# Patient Record
Sex: Male | Born: 1955 | Race: White | Hispanic: No | Marital: Married | Smoking: Never smoker
Health system: Southern US, Community
[De-identification: ages and names within clinical notes are randomized; demographics above are authoritative.]

## PROBLEM LIST (undated history)

## (undated) DIAGNOSIS — M47812 Spondylosis without myelopathy or radiculopathy, cervical region: Principal | ICD-10-CM

## (undated) HISTORY — DX: Spondylosis without myelopathy or radiculopathy, cervical region: M47.812

## (undated) HISTORY — PX: BACK SURGERY: SHX140

## (undated) HISTORY — PX: OTHER SURGICAL HISTORY: SHX169

## (undated) HISTORY — PX: CATARACT EXTRACTION W/ INTRAOCULAR LENS  IMPLANT, BILATERAL: SHX1307

## (undated) HISTORY — PX: FOOT SURGERY: SHX648

---

## 1999-05-02 ENCOUNTER — Emergency Department (HOSPITAL_COMMUNITY): Admission: EM | Admit: 1999-05-02 | Discharge: 1999-05-02 | Payer: Self-pay | Admitting: Emergency Medicine

## 2000-08-09 ENCOUNTER — Encounter: Payer: Self-pay | Admitting: Neurosurgery

## 2000-08-09 ENCOUNTER — Encounter: Admission: RE | Admit: 2000-08-09 | Discharge: 2000-08-09 | Payer: Self-pay | Admitting: Neurosurgery

## 2000-08-29 ENCOUNTER — Encounter: Admission: RE | Admit: 2000-08-29 | Discharge: 2000-08-29 | Payer: Self-pay | Admitting: Neurosurgery

## 2000-08-29 ENCOUNTER — Encounter: Payer: Self-pay | Admitting: Neurosurgery

## 2005-03-07 ENCOUNTER — Emergency Department (HOSPITAL_COMMUNITY): Admission: EM | Admit: 2005-03-07 | Discharge: 2005-03-07 | Payer: Self-pay | Admitting: Emergency Medicine

## 2012-07-30 ENCOUNTER — Other Ambulatory Visit: Payer: Self-pay | Admitting: Family Medicine

## 2012-07-30 ENCOUNTER — Other Ambulatory Visit: Payer: Self-pay

## 2012-07-30 DIAGNOSIS — R109 Unspecified abdominal pain: Secondary | ICD-10-CM

## 2012-07-31 ENCOUNTER — Ambulatory Visit
Admission: RE | Admit: 2012-07-31 | Discharge: 2012-07-31 | Disposition: A | Discharge status: Home / Self Care | Payer: BC Managed Care – PPO | Source: Ambulatory Visit | Attending: Family Medicine | Admitting: Family Medicine

## 2012-07-31 DIAGNOSIS — R109 Unspecified abdominal pain: Secondary | ICD-10-CM

## 2016-12-01 ENCOUNTER — Ambulatory Visit
Admission: RE | Admit: 2016-12-01 | Discharge: 2016-12-01 | Disposition: A | Discharge status: Home / Self Care | Payer: 59 | Source: Ambulatory Visit | Attending: Family Medicine | Admitting: Family Medicine

## 2016-12-01 ENCOUNTER — Other Ambulatory Visit: Payer: Self-pay | Admitting: Student

## 2016-12-01 DIAGNOSIS — J189 Pneumonia, unspecified organism: Secondary | ICD-10-CM

## 2017-04-29 ENCOUNTER — Emergency Department (HOSPITAL_COMMUNITY): Payer: 59

## 2017-04-29 ENCOUNTER — Emergency Department (HOSPITAL_COMMUNITY)
Admission: EM | Admit: 2017-04-29 | Discharge: 2017-04-29 | Disposition: A | Discharge status: Home / Self Care | Payer: 59 | Attending: Emergency Medicine | Admitting: Emergency Medicine

## 2017-04-29 ENCOUNTER — Encounter (HOSPITAL_COMMUNITY): Payer: Self-pay

## 2017-04-29 DIAGNOSIS — M542 Cervicalgia: Secondary | ICD-10-CM

## 2017-04-29 DIAGNOSIS — M436 Torticollis: Secondary | ICD-10-CM | POA: Diagnosis not present

## 2017-04-29 DIAGNOSIS — Z7982 Long term (current) use of aspirin: Secondary | ICD-10-CM | POA: Diagnosis not present

## 2017-04-29 DIAGNOSIS — Z87891 Personal history of nicotine dependence: Secondary | ICD-10-CM | POA: Diagnosis not present

## 2017-04-29 LAB — CBC WITH DIFFERENTIAL/PLATELET
Basophils Absolute: 0 10*3/uL (ref 0.0–0.1)
Basophils Relative: 0 %
Eosinophils Absolute: 0 10*3/uL (ref 0.0–0.7)
Eosinophils Relative: 0 %
HCT: 39.9 % (ref 39.0–52.0)
Hemoglobin: 13.9 g/dL (ref 13.0–17.0)
Lymphocytes Relative: 13 %
Lymphs Abs: 1.5 10*3/uL (ref 0.7–4.0)
MCH: 30.8 pg (ref 26.0–34.0)
MCHC: 34.8 g/dL (ref 30.0–36.0)
MCV: 88.5 fL (ref 78.0–100.0)
Monocytes Absolute: 1 10*3/uL (ref 0.1–1.0)
Monocytes Relative: 9 %
Neutro Abs: 9 10*3/uL — ABNORMAL HIGH (ref 1.7–7.7)
Neutrophils Relative %: 78 %
Platelets: 215 10*3/uL (ref 150–400)
RBC: 4.51 MIL/uL (ref 4.22–5.81)
RDW: 12.2 % (ref 11.5–15.5)
WBC: 11.5 10*3/uL — ABNORMAL HIGH (ref 4.0–10.5)

## 2017-04-29 LAB — BASIC METABOLIC PANEL
Anion gap: 10 (ref 5–15)
BUN: 15 mg/dL (ref 6–20)
CO2: 25 mmol/L (ref 22–32)
Calcium: 9.3 mg/dL (ref 8.9–10.3)
Chloride: 104 mmol/L (ref 101–111)
Creatinine, Ser: 0.71 mg/dL (ref 0.61–1.24)
GFR calc Af Amer: >60 mL/min (ref >60)
GFR calc non Af Amer: >60 mL/min (ref >60)
Glucose, Bld: 215 mg/dL — ABNORMAL HIGH (ref 65–99)
Potassium: 4.1 mmol/L (ref 3.5–5.1)
Sodium: 139 mmol/L (ref 135–145)

## 2017-04-29 LAB — I-STAT CHEM 8, ED
BUN: 16 mg/dL (ref 6–20)
Calcium, Ion: 1.1 mmol/L — ABNORMAL LOW (ref 1.15–1.40)
Chloride: 104 mmol/L (ref 101–111)
Creatinine, Ser: 0.6 mg/dL — ABNORMAL LOW (ref 0.61–1.24)
Glucose, Bld: 220 mg/dL — ABNORMAL HIGH (ref 65–99)
HCT: 41 % (ref 39.0–52.0)
Hemoglobin: 13.9 g/dL (ref 13.0–17.0)
Potassium: 4.1 mmol/L (ref 3.5–5.1)
Sodium: 138 mmol/L (ref 135–145)
TCO2: 27 mmol/L (ref 0–100)

## 2017-04-29 LAB — I-STAT TROPONIN, ED: Troponin i, poc: 0 ng/mL (ref 0.00–0.08)

## 2017-04-29 MED ORDER — ASPIRIN EC 81 MG PO TBEC: 81.0000 mg | DELAYED_RELEASE_TABLET | Freq: Every day | ORAL | 0 refills | Status: AC

## 2017-04-29 MED ORDER — KETOROLAC TROMETHAMINE 60 MG/2ML IM SOLN
60.0000 mg | Freq: Once | INTRAMUSCULAR | Status: AC
  Administered 2017-04-29: 60 mg via INTRAMUSCULAR
  Filled 2017-04-29: qty 2

## 2017-04-29 MED ORDER — DIPHENHYDRAMINE HCL 50 MG/ML IJ SOLN
25.0000 mg | Freq: Once | INTRAMUSCULAR | Status: AC
  Administered 2017-04-29: 25 mg via INTRAVENOUS
  Filled 2017-04-29: qty 1

## 2017-04-29 MED ORDER — DEXAMETHASONE 4 MG PO TABS
10.0000 mg | ORAL_TABLET | Freq: Once | ORAL | Status: AC
  Administered 2017-04-29: 10 mg via ORAL
  Filled 2017-04-29: qty 2

## 2017-04-29 MED ORDER — CYCLOBENZAPRINE HCL 10 MG PO TABS: 10.0000 mg | ORAL_TABLET | Freq: Three times a day (TID) | ORAL | 0 refills | Status: DC | PRN

## 2017-04-29 MED ORDER — METOCLOPRAMIDE HCL 5 MG/ML IJ SOLN
10.0000 mg | Freq: Once | INTRAMUSCULAR | Status: AC
  Administered 2017-04-29: 10 mg via INTRAVENOUS
  Filled 2017-04-29: qty 2

## 2017-04-29 MED ORDER — IOPAMIDOL (ISOVUE-370) INJECTION 76%
INTRAVENOUS | Status: AC
  Administered 2017-04-29: 100 mL via INTRAVENOUS
  Filled 2017-04-29: qty 100

## 2017-04-29 MED ORDER — HYDROCODONE-ACETAMINOPHEN 5-325 MG PO TABS: 1.0000 | ORAL_TABLET | ORAL | 0 refills | Status: DC | PRN

## 2017-04-29 MED ORDER — DIAZEPAM 5 MG PO TABS
5.0000 mg | ORAL_TABLET | Freq: Once | ORAL | Status: AC
  Administered 2017-04-29: 5 mg via ORAL
  Filled 2017-04-29: qty 1

## 2017-04-29 MED ORDER — NAPROXEN 500 MG PO TABS: 500.0000 mg | ORAL_TABLET | Freq: Two times a day (BID) | ORAL | 0 refills | Status: AC

## 2017-04-29 NOTE — ED Provider Notes (Signed)
[facility]-EMERGENCY DEPT Provider Note   CSN: [id number] Arrival date & time: 04/29/17  1348     History   Chief Complaint Chief Complaint  Patient presents with  . Neck Pain    HPI Seth Adams is a 61 y.o. male.  HPI   61 yo M with PMHx as below here with multiple complaints. Pt states that he just had his eyes operated on last month (left and right cataracts s/p IOL implants). He had been recovering well from this. Approx 2 weeks ago, following an argument with this son, he noticed acute onset of left mid and lower facial numbness. He also had transient left arm tingling. Since then, his arm tingling has improved but he has had persistent left facial numbness. Over the past 2 days, he has also noticed posterior neck pain and stiffness. He had subjective chills last night. No vision changes. He went to Dr. Sherryll Burger today thinking it was his eyes but was sent here as his eyes "looked great." Denies h/o CVA or TIA. No current neck stiffness, photophobia, or phonophobia. He is not diabetic or immune suppressed. No other medical complaints.  No past medical history on file.  There are no active problems to display for this patient.   Past Surgical History:  Procedure Laterality Date  . CATARACT EXTRACTION W/ INTRAOCULAR LENS  IMPLANT, BILATERAL         Home Medications    Prior to Admission medications   Medication Sig Start Date End Date Taking? Authorizing Provider  CALCIUM PO Take 1 tablet by mouth daily.   Yes [provider]  Cholecalciferol (VITAMIN D PO) Take 1 tablet by mouth daily.   Yes [provider]  CINNAMON PO Take 1 tablet by mouth 2 (two) times daily.   Yes [provider]  colchicine 0.6 MG tablet Take 0.6 mg by mouth daily.   Yes [provider]  fluticasone (FLONASE) 50 MCG/ACT nasal spray Place 2 sprays into both nostrils daily.   Yes [provider]  GARLIC PO Take 1 tablet by mouth 2 (two) times daily.   Yes  [provider]  Multiple Vitamin (MULTIVITAMIN WITH MINERALS) TABS tablet Take 1 tablet by mouth daily.   Yes [provider]  OVER THE COUNTER MEDICATION Take 5 drops by mouth daily. Mixes Frankincense and Thieves in liquid to drink daily   Yes [provider]  prednisoLONE acetate (PRED FORTE) 1 % ophthalmic suspension Place 1 drop into both eyes daily. 04/21/17  Yes [provider]  TURMERIC PO Take 1 tablet by mouth daily.   Yes [provider]  aspirin EC 81 MG tablet Take 1 tablet (81 mg total) by mouth daily. 04/29/17 05/29/17  Shaune Pollack, MD  cyclobenzaprine (FLEXERIL) 10 MG tablet Take 1 tablet (10 mg total) by mouth 3 (three) times daily as needed for muscle spasms. 04/29/17   Shaune Pollack, MD  HYDROcodone-acetaminophen (NORCO/VICODIN) 5-325 MG tablet Take 1-2 tablets by mouth every 4 (four) hours as needed for severe pain. 04/29/17   Shaune Pollack, MD  naproxen (NAPROSYN) 500 MG tablet Take 1 tablet (500 mg total) by mouth 2 (two) times daily with a meal. 04/29/17 05/06/17  Shaune Pollack, MD    Family History No family history on file.  Social History Social History  Substance Use Topics  . Smoking status: Never Smoker  . Smokeless tobacco: Never Used  . Alcohol use No     Allergies   Codeine   Review  of Systems Review of Systems  Constitutional: Positive for fatigue.  Musculoskeletal: Positive for neck pain and neck stiffness.  Neurological: Positive for numbness and headaches.  All other systems reviewed and are negative.    Physical Exam Updated Vital Signs BP (!) 176/99 (BP Location: Right Arm)   Pulse 78   Temp 98.5 F (36.9 C) (Oral)   Resp 18   SpO2 97%   Physical Exam  Constitutional: He is oriented to person, place, and time. He appears well-developed and well-nourished. No distress.  HENT:  Head: Normocephalic and atraumatic.  Eyes: Conjunctivae are normal.  Neck: Neck supple.  Moderate TTP over  bilateral, L>R SCM and posterior neck muscles. No midline TTP. No deformity.  Cardiovascular: Normal rate, regular rhythm and normal heart sounds.  Exam reveals no friction rub.   No murmur heard. Pulmonary/Chest: Effort normal and breath sounds normal. No respiratory distress. He has no wheezes. He has no rales.  Abdominal: He exhibits no distension.  Musculoskeletal: He exhibits no edema.  Neurological: He is alert and oriented to person, place, and time. He exhibits normal muscle tone.  Skin: Skin is warm. Capillary refill takes less than 2 seconds.  Psychiatric: He has a normal mood and affect.  Nursing note and vitals reviewed.   Neurological Exam:  Mental Status: Alert and oriented to person, place, and time. Attention and concentration normal. Speech clear. Recent memory is intact. Cranial Nerves: Visual fields grossly intact. EOMI and PERRLA. No nystagmus noted. Facial sensation intact at forehead, maxillary cheek, and chin/mandible bilaterally. No facial asymmetry or weakness. Hearing grossly normal. Uvula is midline, and palate elevates symmetrically. Normal SCM and trapezius strength. Tongue midline without fasciculations. Motor: Muscle strength 5/5 in proximal and distal UE and LE bilaterally. No pronator drift. Muscle tone normal. Reflexes: 2+ and symmetrical in all four extremities.  Sensation: Intact to light touch in upper and lower extremities distally bilaterally.  Gait: Normal without ataxia. Coordination: Normal FTN bilaterally.  ED Treatments / Results  Labs (all labs ordered are listed, but only abnormal results are displayed) Labs Reviewed  CBC WITH DIFFERENTIAL/PLATELET - Abnormal; Notable for the following:       Result Value   WBC 11.5 (*)    Neutro Abs 9.0 (*)    All other components within normal limits  BASIC METABOLIC PANEL - Abnormal; Notable for the following:    Glucose, Bld 215 (*)    All other components within normal limits  I-STAT CHEM 8, ED -  Abnormal; Notable for the following:    Creatinine, Ser 0.60 (*)    Glucose, Bld 220 (*)    Calcium, Ion 1.10 (*)    All other components within normal limits  I-STAT TROPOININ, ED    EKG  EKG Interpretation  Date/Time:  Saturday Apr 29 2017 19:42:29 EDT Ventricular Rate:  70 PR Interval:    QRS Duration: 104 QT Interval:  410 QTC Calculation: 443 R Axis:   -29 Text Interpretation:  Sinus rhythm Borderline left axis deviation Confirmed by Daniesha Driver MD, Sheria Lang [phone]) on 04/29/2017 9:02:15 PM       Radiology Ct Angio Head W Or Wo Contrast  Result Date: 04/29/2017 CLINICAL DATA:  Posterior headache.  Neck pain and facial numbness. Patient reports left facial paresthesias for 1 week and neck pain and stiffness for 2 days. EXAM: CT ANGIOGRAPHY HEAD AND NECK TECHNIQUE: Multidetector CT imaging of the head and neck was performed using the standard protocol during bolus administration of intravenous contrast. Multiplanar  CT image reconstructions and MIPs were obtained to evaluate the vascular anatomy. Carotid stenosis measurements (when applicable) are obtained utilizing NASCET criteria, using the distal internal carotid diameter as the denominator. CONTRAST:  100 cc Isovue 370 intravenous COMPARISON:  None. FINDINGS: CT HEAD FINDINGS Brain: No evidence of acute infarction, hemorrhage, hydrocephalus, extra-axial collection or mass lesion/mass effect. Mild to moderate patchy low-density in the cerebral white matter. Vascular: See below Skull: No acute or aggressive finding. Band of scar-like density along the posterior scalp. Sinuses: Clear Orbits: Bilateral cataract resection.  No acute finding. Review of the MIP images confirms the above findings CTA NECK FINDINGS Aortic arch: Mild atherosclerotic calcification. Three vessel branching. Right carotid system: Moderate calcified and noncalcified plaque at the common carotid bifurcation with no significant stenosis. Intermittently distorted ICA due to  motion and streak artifact. No suspected dissection. No beading suspected. Left carotid system: Moderate calcified plaque at the common carotid bifurcation without stenosis or ulceration. Intermittent motion and streak artifact over the ICA without suspected superimposed dissection or beading. Vertebral arteries: No brachiocephalic or subclavian flow limiting stenosis. Intermittently seen atheromatous calcification of the bilateral cervical vertebral arteries. No flow limiting stenosis. No indication of superimposed dissection. Skeleton: Diffuse disc degeneration and endplate spurring, advanced. There is asymmetric bulky left facet arthropathy with C4-5 ankylosis and C2-3 facet arthritis. Advanced asymmetric right C7-T1 facet arthritis. Remote T2 superior endplate fracture. No acute finding to explain neck pain Other neck: No noted inflammation or gross mass lesion. Upper chest: Negative Review of the MIP images confirms the above findings CTA HEAD FINDINGS Anterior circulation: Atheromatous changes on the bilateral carotid siphons without flow limiting stenosis. No major branch occlusion or flow limiting stenosis. Negative for aneurysm. Posterior circulation: Right dominant vertebral artery. No unusual vertebrobasilar branching. The vertebral and basilar arteries are smooth and widely patent. Good patency of bilateral PCAs. Venous sinuses: Patent Anatomic variants: None Delayed phase: No abnormal intracranial enhancement. Review of the MIP images confirms the above findings IMPRESSION: 1. No acute finding or specific explanation for symptoms. No evidence of dissection. 2. Moderate cervical carotid atherosclerosis without flow limiting stenosis. 3. Mild intracranial atherosclerosis. 4. Motion degraded at the level of the neck. 5. Advanced disc and facet degeneration. No acute superimposed finding to explain neck pain. 6. Mild to moderate cerebral white matter disease, nonspecific. Electronically Signed   By:  Marnee Spring M.D.   On: 04/29/2017 17:49   Ct Angio Neck W And/or Wo Contrast  Result Date: 04/29/2017 CLINICAL DATA:  Posterior headache.  Neck pain and facial numbness. Patient reports left facial paresthesias for 1 week and neck pain and stiffness for 2 days. EXAM: CT ANGIOGRAPHY HEAD AND NECK TECHNIQUE: Multidetector CT imaging of the head and neck was performed using the standard protocol during bolus administration of intravenous contrast. Multiplanar CT image reconstructions and MIPs were obtained to evaluate the vascular anatomy. Carotid stenosis measurements (when applicable) are obtained utilizing NASCET criteria, using the distal internal carotid diameter as the denominator. CONTRAST:  100 cc Isovue 370 intravenous COMPARISON:  None. FINDINGS: CT HEAD FINDINGS Brain: No evidence of acute infarction, hemorrhage, hydrocephalus, extra-axial collection or mass lesion/mass effect. Mild to moderate patchy low-density in the cerebral white matter. Vascular: See below Skull: No acute or aggressive finding. Band of scar-like density along the posterior scalp. Sinuses: Clear Orbits: Bilateral cataract resection.  No acute finding. Review of the MIP images confirms the above findings CTA NECK FINDINGS Aortic arch: Mild atherosclerotic calcification. Three vessel branching. Right carotid  system: Moderate calcified and noncalcified plaque at the common carotid bifurcation with no significant stenosis. Intermittently distorted ICA due to motion and streak artifact. No suspected dissection. No beading suspected. Left carotid system: Moderate calcified plaque at the common carotid bifurcation without stenosis or ulceration. Intermittent motion and streak artifact over the ICA without suspected superimposed dissection or beading. Vertebral arteries: No brachiocephalic or subclavian flow limiting stenosis. Intermittently seen atheromatous calcification of the bilateral cervical vertebral arteries. No flow limiting  stenosis. No indication of superimposed dissection. Skeleton: Diffuse disc degeneration and endplate spurring, advanced. There is asymmetric bulky left facet arthropathy with C4-5 ankylosis and C2-3 facet arthritis. Advanced asymmetric right C7-T1 facet arthritis. Remote T2 superior endplate fracture. No acute finding to explain neck pain Other neck: No noted inflammation or gross mass lesion. Upper chest: Negative Review of the MIP images confirms the above findings CTA HEAD FINDINGS Anterior circulation: Atheromatous changes on the bilateral carotid siphons without flow limiting stenosis. No major branch occlusion or flow limiting stenosis. Negative for aneurysm. Posterior circulation: Right dominant vertebral artery. No unusual vertebrobasilar branching. The vertebral and basilar arteries are smooth and widely patent. Good patency of bilateral PCAs. Venous sinuses: Patent Anatomic variants: None Delayed phase: No abnormal intracranial enhancement. Review of the MIP images confirms the above findings IMPRESSION: 1. No acute finding or specific explanation for symptoms. No evidence of dissection. 2. Moderate cervical carotid atherosclerosis without flow limiting stenosis. 3. Mild intracranial atherosclerosis. 4. Motion degraded at the level of the neck. 5. Advanced disc and facet degeneration. No acute superimposed finding to explain neck pain. 6. Mild to moderate cerebral white matter disease, nonspecific. Electronically Signed   By: Marnee SpringJonathon  Watts M.D.   On: 04/29/2017 17:49   Mr Brain Wo Contrast  Result Date: 04/29/2017 CLINICAL DATA:  Posterior headache with neck pain and left facial numbness. EXAM: MRI HEAD WITHOUT CONTRAST TECHNIQUE: Multiplanar, multiecho pulse sequences of the brain and surrounding structures were obtained without intravenous contrast. COMPARISON:  Head CT and CTA from earlier today FINDINGS: Brain: No acute infarction, hemorrhage, hydrocephalus, extra-axial collection or mass lesion.  Moderate cerebral white matter disease with signal abnormality seen from the juxta cortical to periventricular white matter. This pattern is nonspecific and often attributed to chronic small vessel ischemia. Multiple inflammatory processes and demyelinating syndromes can also give this appearance. It is notable but there is no infratentorial signal abnormalities, ovoid radiating pattern, corpus callosum involvement, or black holes as often seen with multiple sclerosis. No abnormality seen along the course of the trigeminal nerves. Vascular: Major flow voids are preserved. Skull and upper cervical spine: Negative for marrow lesion. Sinuses/Orbits: Mild mucosal thickening in the paranasal sinuses, not to a degree that would explain left facial symptoms. IMPRESSION: 1. No acute finding. 2. Moderate cerebral white matter disease that is nonspecific and described above. Electronically Signed   By: Marnee SpringJonathon  Watts M.D.   On: 04/29/2017 19:17    Procedures Procedures (including critical care time)  Medications Ordered in ED Medications  metoCLOPramide (REGLAN) injection 10 mg (10 mg Intravenous Given 04/29/17 1542)  diphenhydrAMINE (BENADRYL) injection 25 mg (25 mg Intravenous Given 04/29/17 1541)  diazepam (VALIUM) tablet 5 mg (5 mg Oral Given 04/29/17 1541)  iopamidol (ISOVUE-370) 76 % injection (100 mLs Intravenous Contrast Given 04/29/17 1720)  ketorolac (TORADOL) injection 60 mg (60 mg Intramuscular Given 04/29/17 1956)  dexamethasone (DECADRON) tablet 10 mg (10 mg Oral Given 04/29/17 1954)     Initial Impression / Assessment and Plan / ED Course  I have reviewed the triage vital signs and the nursing notes.  Pertinent labs & imaging results that were available during my care of the patient were reviewed by me and considered in my medical decision making (see chart for details).    61 yo M with PMHx as above here with left facial numbness/tingling, mild neck pain, posterior headache. On arrival, VSS with  exception of HTN. DDx includes: CVA, TIA, carotid/vertebral dissection given relation to stressful event while talking to son. No fevers, pt is o/w very well appearing - do not suspect meningitis or encephalitis. Will check labs, CT, MR and re-assess.  Labs, imaging as above. Pt has mild leukocytosis which I suspect is 2/2 his prednisone use for his eyes. BMP unremarkable. Trop neg, EKG non-ischemic. CT and MR imaging as below. He does have significant cervical degenerative disease and I highly suspect that pt's sx are 2/2 paracervical muscle spasm 2/2 cervical disc disease, likely exacerbated in setting of yelling during the argument. His facial numbness is along the left aspect of his mouth and does not fit a trigeminal nerve distribution or other neurological distribution. I discussed imaging findings and lab work with the patient and family in detail. I have a low suspicion for CVA, TIA given normal MRI despite constant symptoms, as well as meningitis or encephalitis given his otherwise very well appearance, stable vital signs, absence of fever, and reproducible pain with palpation of paracervical muscles. Given reassuring labs and imaging an improvement with treatment here, will discharge with outpatient follow-up.  Final Clinical Impressions(s) / ED Diagnoses   Final diagnoses:  Cervicalgia  Torticollis    New Prescriptions Discharge Medication List as of 04/29/2017  7:43 PM    START taking these medications   Details  cyclobenzaprine (FLEXERIL) 10 MG tablet Take 1 tablet (10 mg total) by mouth 3 (three) times daily as needed for muscle spasms., Starting Sat 04/29/2017, Print    HYDROcodone-acetaminophen (NORCO/VICODIN) 5-325 MG tablet Take 1-2 tablets by mouth every 4 (four) hours as needed for severe pain., Starting Sat 04/29/2017, Print    naproxen (NAPROSYN) 500 MG tablet Take 1 tablet (500 mg total) by mouth 2 (two) times daily with a meal., Starting Sat 04/29/2017, Until Sat 05/06/2017, Print           Shaune Pollack, MD 04/29/17 2141

## 2017-04-29 NOTE — ED Triage Notes (Signed)
He mentions that he had bilat. Cataract surgery with I.O.L. Implants about a month ago. He is here today with c/o left facial paresthesias x 1 week. He also c/o "really bad" neck pain and stiffness x 2 days. He had initially attributed his left facial paresthesias to the use of his eyedrops, but he is becoming concerned because it is persisting.

## 2017-04-29 NOTE — ED Notes (Signed)
Patient transported to MRI 

## 2017-06-13 ENCOUNTER — Encounter: Payer: Self-pay | Admitting: Neurology

## 2017-06-13 ENCOUNTER — Ambulatory Visit (INDEPENDENT_AMBULATORY_CARE_PROVIDER_SITE_OTHER): Payer: 59 | Admitting: Neurology

## 2017-06-13 VITALS — BP 179/94 | HR 68 | Ht 68.0 in | Wt 185.5 lb

## 2017-06-13 DIAGNOSIS — R202 Paresthesia of skin: Secondary | ICD-10-CM | POA: Diagnosis not present

## 2017-06-13 NOTE — Progress Notes (Signed)
Reason for visit: Left facial and arm numbness  Referring physician: Dr. Rocky LinkElkins  Seth Adams is a 61 y.o. male  History of present illness:  Seth Adams is a 61 year old white male with a history of onset of neck pain and left lower face numbness and numbness of the left hand that began around 04/26/2017. The patient went to the emergency room on May 5, he underwent a TIA workup that included MRI of the brain that showed evidence of a moderate level of chronic small vessel ischemic changes, no acute changes were seen. CT angiography of the head and neck was done and did not show evidence of large vessel high-grade stenosis or occlusion. The patient has continued to report left facial numbness and some tingling in the left hand, he has ongoing neck pain and neck stiffness, he has some discomfort down into the shoulders. He denies any true weakness of the arms or legs, he denies numbness of the right arm or the legs, he has not had any change in balance or difficulty controlling the bowels or the bladder. He has had no visual changes. He reports increased discomfort with turning the head from side to side. He is sent to this office for further management.  No past medical history on file.  Past Surgical History:  Procedure Laterality Date  . BACK SURGERY     Lower back surgery x2  . CATARACT EXTRACTION W/ INTRAOCULAR LENS  IMPLANT, BILATERAL    . FOOT SURGERY      History reviewed. No pertinent family history.  Social history:  reports that he has never smoked. He has never used smokeless tobacco. He reports that he does not drink alcohol. His drug history is not on file.  Medications:  Prior to Admission medications   Medication Sig Start Date End Date Taking? Authorizing Provider  CALCIUM PO Take 1 tablet by mouth daily.   Yes [provider]  Cholecalciferol (VITAMIN D PO) Take 1 tablet by mouth daily.   Yes [provider]  CINNAMON PO Take 1 tablet by mouth  2 (two) times daily.   Yes [provider]  colchicine 0.6 MG tablet Take 0.6 mg by mouth daily.   Yes [provider]  cyclobenzaprine (FLEXERIL) 10 MG tablet Take 1 tablet (10 mg total) by mouth 3 (three) times daily as needed for muscle spasms. 04/29/17  Yes Shaune PollackIsaacs, Cameron, MD  fluticasone (FLONASE) 50 MCG/ACT nasal spray Place 2 sprays into both nostrils daily.   Yes [provider]  GARLIC PO Take 1 tablet by mouth 2 (two) times daily.   Yes [provider]  HYDROcodone-acetaminophen (NORCO/VICODIN) 5-325 MG tablet Take 1-2 tablets by mouth every 4 (four) hours as needed for severe pain. 04/29/17  Yes Shaune PollackIsaacs, Cameron, MD  Multiple Vitamin (MULTIVITAMIN WITH MINERALS) TABS tablet Take 1 tablet by mouth daily.   Yes [provider]  naproxen (NAPROSYN) 500 MG tablet Take 500 mg by mouth as needed. 05/10/17  Yes [provider]  OVER THE COUNTER MEDICATION Take 5 drops by mouth daily. Mixes Frankincense and Thieves in liquid to drink daily   Yes [provider]  prednisoLONE acetate (PRED FORTE) 1 % ophthalmic suspension Place 1 drop into both eyes daily. 04/21/17  Yes [provider]  TURMERIC PO Take 1 tablet by mouth daily.   Yes [provider]      Allergies  Allergen Reactions  . Codeine Itching and Nausea Only  ROS:  Out of a complete 14 system review of symptoms, the patient complains only of the following symptoms, and all other reviewed systems are negative.  Hearing loss Joint pain  Blood pressure (!) 179/94, pulse 68, height 5\' 8"  (1.727 m), weight 185 lb 8 oz (84.1 kg).  Physical Exam  General: The patient is alert and cooperative at the time of the examination.  Eyes: Pupils are equal, round, and reactive to light. Discs are flat bilaterally.  Neck: The neck is supple, no carotid bruits are noted.  Respiratory: The respiratory examination is clear.  Cardiovascular: The cardiovascular  examination reveals a regular rate and rhythm, no obvious murmurs or rubs are noted.  Neuromuscular: The patient lacks about 30 of full lateral rotation to the left, 20 to the right of the cervical spine.  Skin: Extremities are without significant edema.  Neurologic Exam  Mental status: The patient is alert and oriented x 3 at the time of the examination. The patient has apparent normal recent and remote memory, with an apparently normal attention span and concentration ability.  Cranial nerves: Facial symmetry is present. There is good sensation of the face to pinprick and soft touch bilaterally on the forehead, slightly decreased on the left lower face as compared to the right. The strength of the facial muscles and the muscles to head turning and shoulder shrug are normal bilaterally. Speech is well enunciated, no aphasia or dysarthria is noted. Extraocular movements are full. Visual fields are full. The tongue is midline, and the patient has symmetric elevation of the soft palate. No obvious hearing deficits are noted.  Motor: The motor testing reveals 5 over 5 strength of all 4 extremities. Good symmetric motor tone is noted throughout.  Sensory: Sensory testing is intact to pinprick, soft touch, vibration sensation, and position sense on all 4 extremities. No evidence of extinction is noted.  Coordination: Cerebellar testing reveals good finger-nose-finger and heel-to-shin bilaterally.  Gait and station: Gait is normal. Tandem gait is normal. Romberg is negative. No drift is seen.  Reflexes: Deep tendon reflexes are symmetric and normal bilaterally, with exception that there may be a slight elevation in the left biceps reflex as compared the right. Toes are downgoing bilaterally.    MRI brain 04/29/17:  IMPRESSION: 1. No acute finding. 2. Moderate cerebral white matter disease that is nonspecific and described above.  * MRI scan images were reviewed online. I agree with the  written report.   CTA head and neck 04/29/17:  IMPRESSION: 1. No acute finding or specific explanation for symptoms. No evidence of dissection. 2. Moderate cervical carotid atherosclerosis without flow limiting stenosis. 3. Mild intracranial atherosclerosis. 4. Motion degraded at the level of the neck. 5. Advanced disc and facet degeneration. No acute superimposed finding to explain neck pain. 6. Mild to moderate cerebral white matter disease, nonspecific.   Assessment/Plan:  1. Neck stiffness and discomfort  2. Numbness and paresthesias of the left lower face, left hand  The patient has had persistent sensory changes. The patient does have some degree of small vessel ischemic changes but these are not acute in nature. We will need to evaluate the patient for significant cervical spine disease that would explain his neck pain and sensory changes. The patient will be sent for MRI of the cervical spine. He will follow-up in about 3 months.  Marlan Palau MD 06/13/2017 11:37 AM  [facility] [address] [place], [place] [address]  Phone [phone] Fax [phone]

## 2017-06-13 NOTE — Patient Instructions (Signed)
   We will check MRI of the neck. 

## 2017-06-21 ENCOUNTER — Ambulatory Visit (INDEPENDENT_AMBULATORY_CARE_PROVIDER_SITE_OTHER): Payer: 59

## 2017-06-21 ENCOUNTER — Encounter (INDEPENDENT_AMBULATORY_CARE_PROVIDER_SITE_OTHER): Payer: Self-pay

## 2017-06-21 DIAGNOSIS — R202 Paresthesia of skin: Secondary | ICD-10-CM

## 2017-06-24 ENCOUNTER — Telehealth: Payer: Self-pay | Admitting: Neurology

## 2017-06-24 DIAGNOSIS — M47812 Spondylosis without myelopathy or radiculopathy, cervical region: Secondary | ICD-10-CM

## 2017-06-24 MED ORDER — CYCLOBENZAPRINE HCL 10 MG PO TABS: 10.0000 mg | ORAL_TABLET | Freq: Three times a day (TID) | ORAL | 1 refills | Status: DC

## 2017-06-24 NOTE — Telephone Encounter (Signed)
I called patient. The MRI shows some this fullness and degenerative changes, the patient likely is having paresthesias associated with neck spasm, cyclobenzaprine did help him. I will call in another prescription for Flexeril and get him set up for neuromuscular therapy on the neck.   MRI cervical 06/22/17:  IMPRESSION:  Abnormal MRI cervical spine (without) demonstrating: 1. At C5-6: disc bulging and uncovertebral joint hypertrophy with moderate right foraminal stenosis  2. At C6-7: disc bulging with moderate left foraminal stenosis  3. At C2-3: uncovertebral joint hypertrophy with mild left biforaminal stenosis  4. At C3-4: uncovertebral joint hypertrophy with mild right foraminal stenosis

## 2017-06-26 ENCOUNTER — Other Ambulatory Visit: Payer: Self-pay | Admitting: *Deleted

## 2017-06-26 MED ORDER — CYCLOBENZAPRINE HCL 10 MG PO TABS: 10.0000 mg | ORAL_TABLET | Freq: Three times a day (TID) | ORAL | 1 refills | Status: DC

## 2017-09-20 ENCOUNTER — Ambulatory Visit (INDEPENDENT_AMBULATORY_CARE_PROVIDER_SITE_OTHER): Payer: 59 | Admitting: Adult Health

## 2017-09-20 ENCOUNTER — Encounter: Payer: Self-pay | Admitting: Adult Health

## 2017-09-20 VITALS — BP 180/84 | HR 60 | Wt 178.2 lb

## 2017-09-20 DIAGNOSIS — R202 Paresthesia of skin: Secondary | ICD-10-CM | POA: Diagnosis not present

## 2017-09-20 DIAGNOSIS — M542 Cervicalgia: Secondary | ICD-10-CM | POA: Diagnosis not present

## 2017-09-20 NOTE — Progress Notes (Signed)
PATIENT: Seth Adams DOB: [birth year]  REASON FOR VISIT: follow up HISTORY FROM: patient  HISTORY OF PRESENT ILLNESS: Today 09/20/17 Mr. Gailey is a 61 year old male with a history of neck pain and left facial numbness and tingling. He returns today for follow-up. The patient did have a MRI of the cervical spine that shows some degenerative changes. He reports that he continues to have significant neck pain as well as sensory changes on the left side of the face. He simply states that it feels different when you touch the left side versus the right. He also reports some tingling in the left side of face. He denies any numbness or tingling in extremities. He reports that he often feels very stiff and he has trouble moving to the left and right. Neuromuscular therapy was set up for the patient however he reports that he never got a call about this. He has been taking Flexeril at bedtime he feels that may offer him some benefit but has not resolved his neck pain nor sensory changes.   HISTORY 06/13/17: Mr. Gillie is a 60 year old white male with a history of onset of neck pain and left lower face numbness and numbness of the left hand that began around 04/26/2017. The patient went to the emergency room on May 5, he underwent a TIA workup that included MRI of the brain that showed evidence of a moderate level of chronic small vessel ischemic changes, no acute changes were seen. CT angiography of the head and neck was done and did not show evidence of large vessel high-grade stenosis or occlusion. The patient has continued to report left facial numbness and some tingling in the left hand, he has ongoing neck pain and neck stiffness, he has some discomfort down into the shoulders. He denies any true weakness of the arms or legs, he denies numbness of the right arm or the legs, he has not had any change in balance or difficulty controlling the bowels or the bladder. He has had no visual changes. He reports  increased discomfort with turning the head from side to side. He is sent to this office for further management.  REVIEW OF SYSTEMS: Out of a complete 14 system review of symptoms, the patient complains only of the following symptoms, and all other reviewed systems are negative.  See history of present illness  ALLERGIES: Allergies  Allergen Reactions  . Codeine Itching and Nausea Only    HOME MEDICATIONS: Outpatient Medications Prior to Visit  Medication Sig Dispense Refill  . CALCIUM PO Take 1 tablet by mouth daily.    . Cholecalciferol (VITAMIN D PO) Take 1 tablet by mouth daily.    Marland Kitchen CINNAMON PO Take 1 tablet by mouth 2 (two) times daily.    . colchicine 0.6 MG tablet Take 0.6 mg by mouth daily.    . cyclobenzaprine (FLEXERIL) 10 MG tablet Take 1 tablet (10 mg total) by mouth 3 (three) times daily. 90 tablet 1  . fluticasone (FLONASE) 50 MCG/ACT nasal spray Place 2 sprays into both nostrils daily.    Marland Kitchen GARLIC PO Take 1 tablet by mouth 2 (two) times daily.    Marland Kitchen HYDROcodone-acetaminophen (NORCO/VICODIN) 5-325 MG tablet Take 1-2 tablets by mouth every 4 (four) hours as needed for severe pain. 10 tablet 0  . Multiple Vitamin (MULTIVITAMIN WITH MINERALS) TABS tablet Take 1 tablet by mouth daily.    . naproxen (NAPROSYN) 500 MG tablet Take 500 mg by mouth as needed.    Marland Kitchen  OVER THE COUNTER MEDICATION Take 5 drops by mouth daily. Mixes Frankincense and Thieves in liquid to drink daily    . TURMERIC PO Take 1 tablet by mouth daily.    . prednisoLONE acetate (PRED FORTE) 1 % ophthalmic suspension Place 1 drop into both eyes daily.     No facility-administered medications prior to visit.     PAST MEDICAL HISTORY: No past medical history on file.  PAST SURGICAL HISTORY: Past Surgical History:  Procedure Laterality Date  . BACK SURGERY     Lower back surgery x2  . CATARACT EXTRACTION W/ INTRAOCULAR LENS  IMPLANT, BILATERAL    . FOOT SURGERY      FAMILY HISTORY: No family history on  file.  SOCIAL HISTORY: Social History   Social History  . Marital status: Married    Spouse name: Olegario Messier   . Number of children: 1  . Years of education: 46   Occupational History  . Self employed    Social History Main Topics  . Smoking status: Never Smoker  . Smokeless tobacco: Never Used  . Alcohol use No  . Drug use: Unknown  . Sexual activity: Not on file   Other Topics Concern  . Not on file   Social History Narrative   Lives with wife   Caffeine use: 1 5-hour energy drink per day   Right handed      PHYSICAL EXAM  Vitals:   09/20/17 1121  BP: (!) 180/84  Pulse: 60  Weight: 178 lb 3.2 oz (80.8 kg)   Body mass index is 27.1 kg/m.  Generalized: Well developed, in no acute distress   Neurological examination  Mentation: Alert oriented to time, place, history taking. Follows all commands speech and language fluent Cranial nerve II-XII: Pupils were equal round reactive to light. Extraocular movements were full, visual field were full on confrontational test. Facial sensation and strength were normal. Uvula tongue midline. Head turning and shoulder shrug  were normal and symmetric. Motor: The motor testing reveals 5 over 5 strength of all 4 extremities. Good symmetric motor tone is noted throughout.  Sensory: Sensory testing is intact to soft touch on all 4 extremities. No evidence of extinction is noted.  Coordination: Cerebellar testing reveals good finger-nose-finger and heel-to-shin bilaterally.  Gait and station: Gait is normal. Tandem gait is normal. Romberg is negative. No drift is seen.  Reflexes: Deep tendon reflexes are symmetric and normal bilaterally.   DIAGNOSTIC DATA (LABS, IMAGING, TESTING) - I reviewed patient records, labs, notes, testing and imaging myself where available.  Lab Results  Component Value Date   WBC 11.5 (H) 04/29/2017   HGB 13.9 04/29/2017   HCT 41.0 04/29/2017   MCV 88.5 04/29/2017   PLT 215 04/29/2017      Component  Value Date/Time   NA 138 04/29/2017 1553   K 4.1 04/29/2017 1553   CL 104 04/29/2017 1553   CO2 25 04/29/2017 1535   GLUCOSE 220 (H) 04/29/2017 1553   BUN 16 04/29/2017 1553   CREATININE 0.60 (L) 04/29/2017 1553   CALCIUM 9.3 04/29/2017 1535   GFRNONAA >60 04/29/2017 1535   GFRAA >60 04/29/2017 1535      ASSESSMENT AND PLAN 61 y.o. year old male  has no past medical history on file. here with:  1. Neck pain 2. Sensory changes  I will set the patient for neuromuscular therapy. He would like to try more conservative measures first. He will continue on Flexeril at bedtime. I have advised that  he can try eliminating this medication to see if it truly is offering him any benefit. Advised that in the future we may try gabapentin neuromuscular therapy is not beneficial. He voiced understanding. He will follow-up in 3 months or sooner    Butch Penny, MSN, NP-C 09/20/2017, 11:39 AM [facility] [address] [place], [place] [address] [phone]

## 2017-09-20 NOTE — Patient Instructions (Signed)
Your Plan:  We will set you up for neuromuscular therapy Continue Flexeril- you can try stopping the mediation to see if it is offering you any benefit May consider gabapentin in the future  Thank you for coming to see [place] at [facility]. I hope we have been able to provide you high quality care today.  You may receive a patient satisfaction survey over the next few weeks. We would appreciate your feedback and comments so that we may continue to improve ourselves and the health of our patients.  Gabapentin capsules or tablets What is this medicine? GABAPENTIN (GA ba pen tin) is used to control partial seizures in adults with epilepsy. It is also used to treat certain types of nerve pain. This medicine may be used for other purposes; ask your health care provider or pharmacist if you have questions. COMMON BRAND NAME(S): Active-PAC with Gabapentin, Gabarone, Neurontin What should I tell my health care provider before I take this medicine? They need to know if you have any of these conditions: -kidney disease -suicidal thoughts, plans, or attempt; a previous suicide attempt by you or a family member -an unusual or allergic reaction to gabapentin, other medicines, foods, dyes, or preservatives -pregnant or trying to get pregnant -breast-feeding How should I use this medicine? Take this medicine by mouth with a glass of water. Follow the directions on the prescription label. You can take it with or without food. If it upsets your stomach, take it with food.Take your medicine at regular intervals. Do not take it more often than directed. Do not stop taking except on your doctor's advice. If you are directed to break the 600 or 800 mg tablets in half as part of your dose, the extra half tablet should be used for the next dose. If you have not used the extra half tablet within 28 days, it should be thrown away. A special MedGuide will be given to you by the pharmacist with each  prescription and refill. Be sure to read this information carefully each time. Talk to your pediatrician regarding the use of this medicine in children. Special care may be needed. Overdosage: If you think you have taken too much of this medicine contact a poison control center or emergency room at once. NOTE: This medicine is only for you. Do not share this medicine with others. What if I miss a dose? If you miss a dose, take it as soon as you can. If it is almost time for your next dose, take only that dose. Do not take double or extra doses. What may interact with this medicine? Do not take this medicine with any of the following medications: -other gabapentin products This medicine may also interact with the following medications: -alcohol -antacids -antihistamines for allergy, cough and cold -certain medicines for anxiety or sleep -certain medicines for depression or psychotic disturbances -homatropine; hydrocodone -naproxen -narcotic medicines (opiates) for pain -phenothiazines like chlorpromazine, mesoridazine, prochlorperazine, thioridazine This list may not describe all possible interactions. Give your health care provider a list of all the medicines, herbs, non-prescription drugs, or dietary supplements you use. Also tell them if you smoke, drink alcohol, or use illegal drugs. Some items may interact with your medicine. What should I watch for while using this medicine? Visit your doctor or health care professional for regular checks on your progress. You may want to keep a record at home of how you feel your condition is responding to treatment. You may want to share this information  with your doctor or health care professional at each visit. You should contact your doctor or health care professional if your seizures get worse or if you have any new types of seizures. Do not stop taking this medicine or any of your seizure medicines unless instructed by your doctor or health care  professional. Stopping your medicine suddenly can increase your seizures or their severity. Wear a medical identification bracelet or chain if you are taking this medicine for seizures, and carry a card that lists all your medications. You may get drowsy, dizzy, or have blurred vision. Do not drive, use machinery, or do anything that needs mental alertness until you know how this medicine affects you. To reduce dizzy or fainting spells, do not sit or stand up quickly, especially if you are an older patient. Alcohol can increase drowsiness and dizziness. Avoid alcoholic drinks. Your mouth may get dry. Chewing sugarless gum or sucking hard candy, and drinking plenty of water will help. The use of this medicine may increase the chance of suicidal thoughts or actions. Pay special attention to how you are responding while on this medicine. Any worsening of mood, or thoughts of suicide or dying should be reported to your health care professional right away. Women who become pregnant while using this medicine may enroll in the [place] American Antiepileptic Drug Pregnancy Registry by calling 9496674952. This registry collects information about the safety of antiepileptic drug use during pregnancy. What side effects may I notice from receiving this medicine? Side effects that you should report to your doctor or health care professional as soon as possible: -allergic reactions like skin rash, itching or hives, swelling of the face, lips, or tongue -worsening of mood, thoughts or actions of suicide or dying Side effects that usually do not require medical attention (report to your doctor or health care professional if they continue or are bothersome): -constipation -difficulty walking or controlling muscle movements -dizziness -nausea -slurred speech -tiredness -tremors -weight gain This list may not describe all possible side effects. Call your doctor for medical advice about side effects. You may report  side effects to FDA at 1-800-FDA-1088. Where should I keep my medicine? Keep out of reach of children. This medicine may cause accidental overdose and death if it taken by other adults, children, or pets. Mix any unused medicine with a substance like cat litter or coffee grounds. Then throw the medicine away in a sealed container like a sealed bag or a coffee can with a lid. Do not use the medicine after the expiration date. Store at room temperature between 15 and 30 degrees C (59 and 86 degrees F). NOTE: This sheet is a summary. It may not cover all possible information. If you have questions about this medicine, talk to your doctor, pharmacist, or health care provider.  2018 Elsevier/Gold Standard (2014-02-07 15:26:50)

## 2017-09-20 NOTE — Progress Notes (Signed)
I have read the note, and I agree with the clinical assessment and plan.  Seth Adams,Seth Adams   

## 2017-09-21 ENCOUNTER — Ambulatory Visit: Payer: 59 | Attending: Neurology

## 2017-09-21 DIAGNOSIS — M6281 Muscle weakness (generalized): Secondary | ICD-10-CM | POA: Diagnosis present

## 2017-09-21 DIAGNOSIS — M542 Cervicalgia: Secondary | ICD-10-CM | POA: Diagnosis not present

## 2017-09-21 NOTE — Therapy (Signed)
[facility] [address] [place], [place], [address] Phone: [phone]   Fax:  [phone]  Physical Therapy Evaluation  Patient Details  Name: Seth Adams MRN: [id number] Date of Birth: January 03, 1964 Referring Provider: Dr. Anne Hahn  Encounter Date: 09/21/2017      PT End of Session - 09/21/17 1214    Visit Number 1   Number of Visits 12   Date for PT Re-Evaluation 11/05/17   Authorization Type Sharma Covert regarding visit limit or auth   PT Start Time 1102   PT Stop Time 1144   PT Time Calculation (min) 42 min   Activity Tolerance Patient tolerated treatment well   Behavior During Therapy [facility] for tasks assessed/performed      History reviewed. No pertinent past medical history.  Past Surgical History:  Procedure Laterality Date  . BACK SURGERY     Lower back surgery x2  . CATARACT EXTRACTION W/ INTRAOCULAR LENS  IMPLANT, BILATERAL    . FOOT SURGERY      There were no vitals filed for this visit.      Subjective Assessment - 09/21/17 1108    Subjective Pt reported neck pain began end of 03/2017, after cataract surgery (he had to place drops in eyes and extend neck), he doesn't believe the surgery caused the pain. Pt's eye doctor thought pt had meningitis and sent pt to ED, but imaging indicated cervical spondylosis and chronic small vessel ischemic changes. Pt reports neck pain is 10/10 at worst, at best: 4-5/10. Pt reports pain wakes him up at night. Pt reports pain incr. with turning head R and L, pain is relieved with Crista Elliot and not turning head. Pt sleeps on his R side, when pain is severe he has to sleep on his back. Pt sleeps with a Mypillow and must sleep propped up 2/2 acid reflux. 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 Pt holds phone with hands but does not sidebend neck. Pt drives about 200 miles a day.    Pertinent History B cataract surgery 03/2017, chronic small  vessel ischemic changes   Patient Stated Goals Reduce pain   Currently in Pain? Yes   Pain Score --  5-6/10   Pain Location Neck   Pain Orientation Left;Right   Pain Descriptors / Indicators Aching   Pain Type Chronic pain   Pain Onset More than a month ago   Pain Frequency Constant   Aggravating Factors  turning head R and L   Pain Relieving Factors Crista Elliot            [facility] PT Assessment - 09/21/17 1124      Assessment   Medical Diagnosis Cervical spondylosis without myelopathy   Referring Provider Dr. Anne Hahn   Onset Date/Surgical Date 04/21/17   Hand Dominance Right   Prior Therapy none     Precautions   Precautions None     Restrictions   Weight Bearing Restrictions No     Balance Screen   Has the patient fallen in the past 6 months No   Has the patient had a decrease in activity level because of a fear of falling?  No   Is the patient reluctant to leave their home because of a fear of falling?  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      Sensation   Light Touch Impaired by gross assessment   Additional Comments BUE and cervical dermatomes intact but pt reported L facial  N/T since onset of neck pain.      Posture/Postural Control   Posture/Postural Control Postural limitations   Postural Limitations Forward head     ROM / Strength   AROM / PROM / Strength AROM;Strength     AROM   Overall AROM  Deficits   AROM Assessment Site Cervical   Cervical Flexion 43  pt reported discomfort   Cervical Extension 39  with pain   Cervical - Right Side Bend 18  with pain   Cervical - Left Side Bend 16  with pain   Cervical - Right Rotation 55  with pain   Cervical - Left Rotation 33  with pain     Strength   Overall Strength Deficits   Overall Strength Comments Pt utilized compensatory strategies during deep neck flexor strength testing and could not hold > 5 seconds in supine.    Strength Assessment Site Cervical;Elbow;Shoulder   Right/Left Shoulder Right;Left   Right Shoulder Flexion 4/5   Right Shoulder Extension 4/5   Right Shoulder ABduction 4-/5   Left Shoulder Flexion 3+/5  with pt reporting neck pain   Left Shoulder Extension 4/5   Left Shoulder ABduction 3+/5  with pt reporting neck pain   Right/Left Elbow Right;Left   Right Elbow Flexion 5/5   Right Elbow Extension 5/5   Left Elbow Flexion 5/5   Left Elbow Extension 5/5     Palpation   Spinal mobility Hypomobility of cervical spine noted during PAs. Pt reported TTP during palpation of B levator scap. and upper traps.     Special Tests    Special Tests Cervical   Cervical Tests other     other    Comment Pt reported incr. cervical pain with manual traction.                              PT Education - 09/21/17 1211    Education provided Yes   Education Details PT educated pt on exam findings, PT POC, frequency and duration. PT educated pt on PT treatments, as pt reported "I don't want you cracking my neck and sending me into a wheelchair." Pt reported this happened to a friend, and that's why he was hesitant to trial PT. PT discussed the importance of proper  posture, especially while driving (PT will provide pt on Travel Buddy information next session). PT discussed pt trialing cervical retraction while driving to decr. FHP and improve strength.    Person(s) Educated Patient   Methods Explanation;Demonstration;Verbal cues   Comprehension Verbalized understanding;Returned demonstration;Need further instruction  PT Short Term Goals - 09/21/17 1237      PT SHORT TERM GOAL #1   Title same as LTGs           PT Long Term Goals - 09/21/17 1237      PT LONG TERM GOAL #1   Title Pt will be IND in HEP to improve pain, strength, and posture. TARGET DATE FOR ALL LTGS: 11/02/17   Status New     PT LONG TERM GOAL #2   Title Pt will improve NDI score to </=22% to improve quality of life.    Status New     PT LONG TERM GOAL #3   Title Pt will perform cervical ROM in all directions (flex,ext, rot, sidebending) WNL to improve functional mobility.    Status New     PT LONG TERM GOAL #4   Title Pt will report cervical pain at worst is </=2/10 in order to perform ADLs.   Status New     PT LONG TERM GOAL #5   Title Pt will improve L shoulder strength (flex/ext/abd) to 4+/5 in order to perform ADLs and work activities without pain.   Status New               Plan - 09/21/17 1219    Clinical Impression Statement Pt is a pleasant 61y/o male presenting to [facility] neuro for cervical spondylosis without myelopathy. Pt's PMH signficant for the following: B cataract surgery 03/2017, chronic small vessel ischemic changes. The following impairments were decr. cervical ROM, decr. BUE and cervical strength, pain, and decr. flexibility. Pt's NDI scores pt perceives a severe disability related to neck pain. Pt would benefit from skilled PT to decr. pain and improve functional mobilty.   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   Clinical Presentation Stable   Clinical Presentation  due to: B cataract surgery 03/2017, chronic small vessel ischemic changes   Clinical Decision Making Low   Rehab Potential Good   Clinical Impairments Affecting Rehab Potential see above   PT Frequency 2x / week   PT Duration 6 weeks   PT Treatment/Interventions ADLs/Self Care Home Management;Biofeedback;Cryotherapy;Electrical Stimulation;Iontophoresis /ml Dexamethasone;Moist Heat;Traction;Ultrasound;Neuromuscular re-education;Therapeutic exercise;Therapeutic activities;Gait training;DME Instruction;Patient/family education;Vestibular;Manual techniques;Dry needling   PT Next Visit Plan Establish HEP (stretches and strenthening), manual therapy as tolerated, evaluate for dry needling as indicated. Provide pt with posture info (travel buddy for driving)   Consulted and Agree with Plan of Care Patient      Patient will benefit from skilled therapeutic intervention in order to improve the following deficits and impairments:  Impaired flexibility, Postural dysfunction, Decreased range of motion, Decreased mobility, Decreased strength, Pain, Impaired sensation, Hypomobility  Visit Diagnosis: Cervicalgia  Muscle weakness (generalized)     Problem List There are no active problems to display for this patient.   Oyinkansola Truax L 09/21/2017, 12:40 PM  [facility] [address] [place], [place], [address] Phone: [phone]   Fax:  [phone]  Name: GRADEN HOSHINO MRN: [id number] Date of Birth: 1956/09/30  Zerita Boers, PT,DPT 09/21/17 12:43 PM Phone: [phone] Fax: [phone]

## 2017-09-27 ENCOUNTER — Ambulatory Visit: Payer: 59 | Admitting: Physical Therapy

## 2017-09-28 ENCOUNTER — Encounter: Payer: Self-pay | Admitting: Physical Therapy

## 2017-09-28 ENCOUNTER — Ambulatory Visit: Payer: 59 | Attending: Neurology | Admitting: Physical Therapy

## 2017-09-28 DIAGNOSIS — M6281 Muscle weakness (generalized): Secondary | ICD-10-CM | POA: Diagnosis present

## 2017-09-28 DIAGNOSIS — M542 Cervicalgia: Secondary | ICD-10-CM | POA: Diagnosis not present

## 2017-09-28 NOTE — Therapy (Addendum)
[facility] [address] [place], [place], [address] Phone: [phone]   Fax:  [phone]  Physical Therapy Treatment  Patient Details  Name: Seth Adams MRN: [id number] Date of Birth: [birth year] Referring Provider: Dr. Anne Hahn  Encounter Date: 09/28/2017      PT End of Session - 09/28/17 1457    Visit Number 2   Number of Visits 12   Date for PT Re-Evaluation 11/05/17   Authorization Type Sharma Covert regarding visit limit or auth   PT Start Time 1407   PT Stop Time 1500   PT Time Calculation (min) 53 min   Activity Tolerance Patient tolerated treatment well   Behavior During Therapy [facility] for tasks assessed/performed      History reviewed. No pertinent past medical history.  Past Surgical History:  Procedure Laterality Date  . BACK SURGERY     Lower back surgery x2  . CATARACT EXTRACTION W/ INTRAOCULAR LENS  IMPLANT, BILATERAL    . FOOT SURGERY      There were no vitals filed for this visit.      Subjective Assessment - 09/28/17 1410    Subjective Its been really bad this week.  Pain with turning head, starting to keep him up at night.  Still numb in L side of face.    Currently in Pain? Yes   Pain Score 6   "medium"   Pain Location Neck   Pain Orientation Left;Right;Posterior   Pain Descriptors / Indicators Aching   Pain Type Chronic pain   Pain Onset More than a month ago   Pain Frequency Intermittent             [facility] Adult PT Treatment/Exercise - 09/28/17 0001      Neck Exercises: Supine   Neck Retraction 10 reps;5 secs   Neck Retraction Limitations chin tuck into ball, (pain with extension)    Cervical Rotation Both;10 reps   Cervical Rotation Limitations into small soft ball    Other Supine Exercise scapular retraction x 10      Shoulder Exercises: Supine   Horizontal ABduction Strengthening;Both;10 reps   Theraband Level (Shoulder Horizontal ABduction) Level 3 (Green)   Horizontal  ABduction Weight (lbs) chin tuck    External Rotation Strengthening;Both;10 reps   Other Supine Exercises overhead lift with green band light tension   x 10      Moist Heat Therapy   Number Minutes Moist Heat 10 Minutes   Moist Heat Location Cervical     Manual Therapy   Manual Therapy Soft tissue mobilization;Myofascial release;Passive ROM;Manual Traction   Soft tissue mobilization suboccipitals    Myofascial Release posterior cervical      Neck Exercises: Stretches   Upper Trapezius Stretch 2 reps;30 seconds   Levator Stretch 2 reps;30 seconds   Other Neck Stretches rotation                 PT Education - 09/28/17 1546    Education provided Yes   Education Details Posture, stabilization, causes of L facial numbness and jaw pain, dry needling, HEP , POC    Person(s) Educated Patient   Methods Explanation;Handout;Tactile cues;Demonstration;Verbal cues   Comprehension Verbalized understanding;Returned demonstration;Need further instruction          PT Short Term Goals - 09/21/17 1237      PT SHORT TERM GOAL #1   Title same as LTGs           PT Long Term Goals - 09/28/17  1548      PT LONG TERM GOAL #1   Title Pt will be IND in HEP to improve pain, strength, and posture. TARGET DATE FOR ALL LTGS: 11/02/17   Status On-going     PT LONG TERM GOAL #2   Title Pt will improve NDI score to </=22% to improve quality of life.    Status Unable to assess     PT LONG TERM GOAL #3   Title Pt will perform cervical ROM in all directions (flex,ext, rot, sidebending) WNL to improve functional mobility.    Status On-going     PT LONG TERM GOAL #4   Title Pt will report cervical pain at worst is </=2/10 in order to perform ADLs.   Status On-going     PT LONG TERM GOAL #5   Title Pt will improve L shoulder strength (flex/ext/abd) to 4+/5 in order to perform ADLs and work activities without pain.   Status On-going               Plan - 09/28/17 1549     Clinical Impression Statement Patient's first treatment today, tolerated well, noted min reduction in symptoms following manual and cervical exercises. Concerning is L cheek and mouth numbness, may correlate with trigger point pain in masseter, or pterygoids. Patient concerned about "small vessel ischemic changes", but I told him that if that was significant, his MD would have mentioned it and treated for it.    PT Next Visit Plan Establish HEP (stretches and strenthening), manual therapy as tolerated, evaluate for dry needling as indicated. Provide pt with posture info   PT Home Exercise Plan cervical retraction, scapular retraction, UT and LS stretching, rotation    Consulted and Agree with Plan of Care Patient      Patient will benefit from skilled therapeutic intervention in order to improve the following deficits and impairments:  Impaired flexibility, Postural dysfunction, Decreased range of motion, Decreased mobility, Decreased strength, Pain, Impaired sensation, Hypomobility  Visit Diagnosis: Cervicalgia  Muscle weakness (generalized)     Problem List There are no active problems to display for this patient.   PAA,JENNIFER 09/28/2017, 4:00 PM  [facility] [address] [place], [place], [address] Phone: [phone]   Fax:  [phone]  Name: Seth Adams MRN: [id number] Date of Birth: 1956/06/13  Karie Mainland, PT 09/28/17 4:00 PM Phone: [phone] Fax: [phone]

## 2017-10-03 ENCOUNTER — Encounter: Payer: Self-pay | Admitting: Physical Therapy

## 2017-10-03 ENCOUNTER — Ambulatory Visit: Payer: 59 | Admitting: Physical Therapy

## 2017-10-03 DIAGNOSIS — M542 Cervicalgia: Secondary | ICD-10-CM | POA: Diagnosis not present

## 2017-10-03 DIAGNOSIS — M6281 Muscle weakness (generalized): Secondary | ICD-10-CM

## 2017-10-03 NOTE — Therapy (Signed)
[facility] [address] [place], [place], [address] Phone: [phone]   Fax:  [phone]  Physical Therapy Treatment  Patient Details  Name: Seth Adams MRN: [id number] Date of Birth: [birth year] Referring Provider: Dr. Anne Hahn  Encounter Date: 10/03/2017      PT End of Session - 10/03/17 1326    Visit Number 3   Number of Visits 12   Date for PT Re-Evaluation 11/05/17   PT Start Time 1328   PT Stop Time 1416   PT Time Calculation (min) 48 min   Activity Tolerance Patient tolerated treatment well   Behavior During Therapy [facility] for tasks assessed/performed      History reviewed. No pertinent past medical history.  Past Surgical History:  Procedure Laterality Date  . BACK SURGERY     Lower back surgery x2  . CATARACT EXTRACTION W/ INTRAOCULAR LENS  IMPLANT, BILATERAL    . FOOT SURGERY      There were no vitals filed for this visit.      Subjective Assessment - 10/03/17 1415    Subjective " I am having soreness today and it is starting to climb up there"    Currently in Pain? Yes   Pain Score 8    Pain Location Neck   Pain Orientation Left;Right   Pain Descriptors / Indicators Aching;Tightness   Pain Type Chronic pain   Pain Onset More than a month ago   Pain Frequency Intermittent   Aggravating Factors  turning the head   Pain Relieving Factors ben gay                         [facility] Adult PT Treatment/Exercise - 10/03/17 1326      Moist Heat Therapy   Number Minutes Moist Heat 5 Minutes   Moist Heat Location Cervical     Manual Therapy   Manual Therapy Joint mobilization;Taping   Joint Mobilization 1st rib mob bil grade 3 with pt deep breathing in/out for mob with movement   Soft tissue mobilization IASTM over bil sub-occipitals, and upper trap/ levator scapulae   Myofascial Release fascial rolling/ stretching over bil upper trap/levator scapulae   McConnell L upper trap inhibition  taping  trial     Neck Exercises: Stretches   Upper Trapezius Stretch 30 seconds;3 reps  contract/ relax with 10 sec contraction   Levator Stretch 2 reps;30 seconds          Trigger Point Dry Needling - 10/03/17 1325    Consent Given? Yes   Education Handout Provided Yes   Muscles Treated Upper Body Upper trapezius              PT Education - 10/03/17 1325    Education provided Yes   Education Details muscle anatomy and referral patterns. What TPDN is, benefits/ what to expect and aftercare.  Benefits of taping and length of wear   Person(s) Educated Patient   Methods Explanation;Verbal cues   Comprehension Verbalized understanding;Verbal cues required          PT Short Term Goals - 09/21/17 1237      PT SHORT TERM GOAL #1   Title same as LTGs           PT Long Term Goals - 09/28/17 1548      PT LONG TERM GOAL #1   Title Pt will be IND in HEP to improve pain, strength, and posture. TARGET DATE FOR ALL LTGS:  11/02/17   Status On-going     PT LONG TERM GOAL #2   Title Pt will improve NDI score to </=22% to improve quality of life.    Status Unable to assess     PT LONG TERM GOAL #3   Title Pt will perform cervical ROM in all directions (flex,ext, rot, sidebending) WNL to improve functional mobility.    Status On-going     PT LONG TERM GOAL #4   Title Pt will report cervical pain at worst is </=2/10 in order to perform ADLs.   Status On-going     PT LONG TERM GOAL #5   Title Pt will improve L shoulder strength (flex/ext/abd) to 4+/5 in order to perform ADLs and work activities without pain.   Status On-going               Plan - 10/03/17 1415    Clinical Impression Statement pt reports increased soreness today. explain and performed TPDN over the bil upper trap followed with IASTM techniques. following mobs of the thoracic spine and rib mobs he reported decreased pain and tightness, and demonstrated increased cervical ROM with L rotation.     PT Treatment/Interventions ADLs/Self Care Home Management;Biofeedback;Cryotherapy;Electrical Stimulation;Iontophoresis /ml Dexamethasone;Moist Heat;Traction;Ultrasound;Neuromuscular re-education;Therapeutic exercise;Therapeutic activities;Gait training;DME Instruction;Patient/family education;Vestibular;Manual techniques;Dry needling   PT Next Visit Plan how was DN, Establish HEP (stretches and strenthening), manual therapy as tolerated, evaluate for dry needling as indicated. Provide pt with posture info   PT Home Exercise Plan cervical retraction, scapular retraction, UT and LS stretching, rotation    Consulted and Agree with Plan of Care Patient      Patient will benefit from skilled therapeutic intervention in order to improve the following deficits and impairments:  Impaired flexibility, Postural dysfunction, Decreased range of motion, Decreased mobility, Decreased strength, Pain, Impaired sensation, Hypomobility  Visit Diagnosis: Cervicalgia  Muscle weakness (generalized)     Problem List There are no active problems to display for this patient.  Lulu Riding PT, DPT, LAT, ATC  10/03/17  2:19 PM      [facility] [address] [place], [place], [address] Phone: [phone]   Fax:  [phone]  Name: Seth Adams MRN: [id number] Date of Birth: 01-Mar-1956

## 2017-10-05 ENCOUNTER — Ambulatory Visit: Payer: 59 | Admitting: Physical Therapy

## 2017-10-05 ENCOUNTER — Encounter: Payer: Self-pay | Admitting: Physical Therapy

## 2017-10-05 DIAGNOSIS — M6281 Muscle weakness (generalized): Secondary | ICD-10-CM

## 2017-10-05 DIAGNOSIS — M542 Cervicalgia: Secondary | ICD-10-CM | POA: Diagnosis not present

## 2017-10-05 NOTE — Therapy (Signed)
[facility] [address] [place], [place], [address] Phone: [phone]   Fax:  [phone]  Physical Therapy Treatment  Patient Details  Name: Seth Adams MRN: [id number] Date of Birth: [birth year] Referring Provider: Dr. Anne Hahn  Encounter Date: 10/05/2017      PT End of Session - 10/05/17 1429    Visit Number 4   Number of Visits 12   Date for PT Re-Evaluation 11/05/17   Authorization Type Sharma Covert regarding visit limit or auth   PT Start Time 1330   PT Stop Time 1413   PT Time Calculation (min) 43 min   Activity Tolerance Patient tolerated treatment well   Behavior During Therapy [facility] for tasks assessed/performed      History reviewed. No pertinent past medical history.  Past Surgical History:  Procedure Laterality Date  . BACK SURGERY     Lower back surgery x2  . CATARACT EXTRACTION W/ INTRAOCULAR LENS  IMPLANT, BILATERAL    . FOOT SURGERY      There were no vitals filed for this visit.      Subjective Assessment - 10/05/17 1321    Subjective "the DN really helped out, and I can feel like I am turning my head more"    Currently in Pain? Yes   Pain Score 4    Pain Location Neck   Pain Orientation Right;Left   Pain Descriptors / Indicators Aching;Tightness   Pain Type Chronic pain                         [facility] Adult PT Treatment/Exercise - 10/05/17 1338      Shoulder Exercises: Seated   Other Seated Exercises lower trap activation 2 x 12 with red theraband and elbows on boslter  tactile/ verbal cues on proper form     Shoulder Exercises: Standing   Extension Strengthening;Both;10 reps;Theraband  red theraband   Row Strengthening;Both;15 reps;Theraband     Shoulder Exercises: ROM/Strengthening   UBE (Upper Arm Bike) L2 x 4 min  changing direction at 2 min     Manual Therapy   Soft tissue mobilization IASTM over bil sub-occipitals, and upper trap/ levator scapulae   Myofascial Release fascial rolling/ stretching over bil upper trap/levator scapulae   McConnell L upper trap inhibition taping     Neck Exercises: Stretches   Upper Trapezius Stretch 30 seconds;3 reps  contract/ relax with 10 sec hold          Trigger Point Dry Needling - 10/05/17 1336    Consent Given? Yes   Education Handout Provided No   Muscles Treated Upper Body Upper trapezius;Levator scapulae   Upper Trapezius Response Twitch reponse elicited;Palpable increased muscle length   Levator Scapulae Response Twitch response elicited;Palpable increased muscle length              PT Education - 10/05/17 1424    Education provided Yes   Education Details updated HEP for lower trap activation   Person(s) Educated Patient   Methods Explanation;Verbal cues   Comprehension Verbalized understanding;Verbal cues required          PT Short Term Goals - 09/21/17 1237      PT SHORT TERM GOAL #1   Title same as LTGs           PT Long Term Goals - 09/28/17 1548      PT LONG TERM GOAL #1   Title Pt will be IND in HEP to  improve pain, strength, and posture. TARGET DATE FOR ALL LTGS: 11/02/17   Status On-going     PT LONG TERM GOAL #2   Title Pt will improve NDI score to </=22% to improve quality of life.    Status Unable to assess     PT LONG TERM GOAL #3   Title Pt will perform cervical ROM in all directions (flex,ext, rot, sidebending) WNL to improve functional mobility.    Status On-going     PT LONG TERM GOAL #4   Title Pt will report cervical pain at worst is </=2/10 in order to perform ADLs.   Status On-going     PT LONG TERM GOAL #5   Title Pt will improve L shoulder strength (flex/ext/abd) to 4+/5 in order to perform ADLs and work activities without pain.   Status On-going               Plan - 10/05/17 1426    Clinical Impression Statement pt reports improvement with pain since the last session. Continued TPDN over bil upper trap and levator  scapulae followed with IASTM techniques and throacic mobs. Progress to scapular stability exercise with pt required verbal and tactile cues for proper form. post session he reported pain dropped to 1/10 and declined modalities.    PT Next Visit Plan how was DN, Establish HEP (stretches and strenthening), manual therapy as tolerated, evaluate for dry needling as indicated. Provide pt with posture info   PT Home Exercise Plan cervical retraction, scapular retraction, UT and LS stretching, rotation    Consulted and Agree with Plan of Care Patient      Patient will benefit from skilled therapeutic intervention in order to improve the following deficits and impairments:  Impaired flexibility, Postural dysfunction, Decreased range of motion, Decreased mobility, Decreased strength, Pain, Impaired sensation, Hypomobility  Visit Diagnosis: Cervicalgia  Muscle weakness (generalized)     Problem List There are no active problems to display for this patient.  Lulu Riding PT, DPT, LAT, ATC  10/05/17  2:31 PM      [facility] [address] [place], [place], [address] Phone: [phone]   Fax:  [phone]  Name: Seth Adams MRN: [id number] Date of Birth: [birth year]

## 2017-10-09 ENCOUNTER — Encounter: Payer: Self-pay | Admitting: Physical Therapy

## 2017-10-09 ENCOUNTER — Ambulatory Visit: Payer: 59 | Admitting: Physical Therapy

## 2017-10-09 DIAGNOSIS — M542 Cervicalgia: Secondary | ICD-10-CM

## 2017-10-09 DIAGNOSIS — M6281 Muscle weakness (generalized): Secondary | ICD-10-CM

## 2017-10-09 NOTE — Therapy (Signed)
[facility] [address] [place], [place], [address] Phone: [phone]   Fax:  [phone]  Physical Therapy Treatment  Patient Details  Name: Seth Adams MRN: [id number] Date of Birth: 07-13-1956 Referring Provider: Dr. Anne Hahn  Encounter Date: 10/09/2017      PT End of Session - 10/09/17 1302    Visit Number 5   Number of Visits 12   Date for PT Re-Evaluation 11/05/17   Authorization Type Sharma Covert regarding visit limit or auth   PT Start Time 1100   PT Stop Time 1140   PT Time Calculation (min) 40 min   Activity Tolerance Patient tolerated treatment well   Behavior During Therapy [facility] for tasks assessed/performed      History reviewed. No pertinent past medical history.  Past Surgical History:  Procedure Laterality Date  . BACK SURGERY     Lower back surgery x2  . CATARACT EXTRACTION W/ INTRAOCULAR LENS  IMPLANT, BILATERAL    . FOOT SURGERY      There were no vitals filed for this visit.      Subjective Assessment - 10/09/17 1105    Subjective " I think I am doing about the same as last week"   Currently in Pain? Yes   Pain Score 5    Pain Orientation Right;Left   Pain Descriptors / Indicators Aching   Pain Onset More than a month ago   Pain Frequency Intermittent                         [facility] Adult PT Treatment/Exercise - 10/09/17 1108      Self-Care   Self-Care Other Self-Care Comments   Other Self-Care Comments  manual trigger point release techniques using theracae     Neck Exercises: Supine   Neck Retraction 10 reps;10 secs  with head lift     Shoulder Exercises: Prone   Other Prone Exercises I's T's and Y's 2x 10 ea  2#     Manual Therapy   Manual therapy comments sub-occipital release x 5 min   Soft tissue mobilization IASTM over bil sub-occipitals, and upper trap/ levator scapulae     Neck Exercises: Stretches   Upper Trapezius Stretch 2 reps;30 seconds;Other  (comment)  reviewed how to perform properly   Levator Stretch 2 reps;30 seconds  how to perform properly                PT Education - 10/09/17 1301    Education provided Yes   Education Details benefits of manual trigger point release and how to perform at home using theracane.   Person(s) Educated Patient   Methods Explanation;Handout;Demonstration   Comprehension Verbalized understanding;Verbal cues required;Returned demonstration          PT Short Term Goals - 09/21/17 1237      PT SHORT TERM GOAL #1   Title same as LTGs           PT Long Term Goals - 09/28/17 1548      PT LONG TERM GOAL #1   Title Pt will be IND in HEP to improve pain, strength, and posture. TARGET DATE FOR ALL LTGS: 11/02/17   Status On-going     PT LONG TERM GOAL #2   Title Pt will improve NDI score to </=22% to improve quality of life.    Status Unable to assess     PT LONG TERM GOAL #3   Title Pt will  perform cervical ROM in all directions (flex,ext, rot, sidebending) WNL to improve functional mobility.    Status On-going     PT LONG TERM GOAL #4   Title Pt will report cervical pain at worst is </=2/10 in order to perform ADLs.   Status On-going     PT LONG TERM GOAL #5   Title Pt will improve L shoulder strength (flex/ext/abd) to 4+/5 in order to perform ADLs and work activities without pain.   Status On-going               Plan - 10/09/17 1302    Clinical Impression Statement pt reports feeling about the same as last session. Educated and peformed how to relieve trigger points using theracane. continued scapular stabilization exercises which he reported decreased pain and tightness.    PT Next Visit Plan how was DN, Establish HEP (stretches and strenthening), manual therapy as tolerated, evaluate for dry needling as indicated. Provide pt with posture info   PT Home Exercise Plan cervical retraction, scapular retraction, UT and LS stretching, rotation    Consulted and  Agree with Plan of Care Patient      Patient will benefit from skilled therapeutic intervention in order to improve the following deficits and impairments:  Impaired flexibility, Postural dysfunction, Decreased range of motion, Decreased mobility, Decreased strength, Pain, Impaired sensation, Hypomobility  Visit Diagnosis: Cervicalgia  Muscle weakness (generalized)     Problem List There are no active problems to display for this patient.  Lulu Riding PT, DPT, LAT, ATC  10/09/17  1:04 PM      [facility] [address] [place], [place], [address] Phone: [phone]   Fax:  [phone]  Name: Seth Adams MRN: [id number] Date of Birth: 16-Jan-1956

## 2017-10-18 ENCOUNTER — Encounter: Payer: 59 | Admitting: Physical Therapy

## 2017-10-26 ENCOUNTER — Ambulatory Visit: Payer: 59 | Attending: Family Medicine | Admitting: Physical Therapy

## 2017-10-26 DIAGNOSIS — M542 Cervicalgia: Secondary | ICD-10-CM

## 2017-10-26 DIAGNOSIS — M6281 Muscle weakness (generalized): Secondary | ICD-10-CM | POA: Diagnosis present

## 2017-10-26 NOTE — Therapy (Addendum)
[place], [place], [address] Phone: [phone]   Fax:  [phone]  Physical Therapy Treatment / Discharge Summary  Patient Details  Name: Seth Adams MRN: [id number] Date of Birth: 1956-12-15 Referring Provider: Dr. Jannifer Franklin  Encounter Date: 10/26/2017      PT End of Session - 10/26/17 1522    Visit Number 6   Number of Visits 12   Date for PT Re-Evaluation 11/05/17   Authorization Type Ena Dawley regarding visit limit or auth   Activity Tolerance Patient tolerated treatment well   Behavior During Therapy [facility] for tasks assessed/performed      No past medical history on file.  Past Surgical History:  Procedure Laterality Date  . BACK SURGERY     Lower back surgery x2  . CATARACT EXTRACTION W/ INTRAOCULAR LENS  IMPLANT, BILATERAL    . FOOT SURGERY      There were no vitals filed for this visit.      Subjective Assessment - 10/26/17 1519    Subjective For the past week and a half the patient reports he has had an increase in pain. He feels like his neck has gone back  to the same as it was. He is ahving a hard time turning his head.    Pertinent History B cataract surgery 03/2017, chronic small vessel ischemic changes   Patient Stated Goals Reduce pain   Currently in Pain? Yes   Pain Score 7    Pain Location Neck   Pain Orientation Right;Left   Pain Descriptors / Indicators Aching   Pain Type Chronic pain   Pain Onset More than a month ago   Pain Frequency Intermittent   Aggravating Factors  turning his head    Pain Relieving Factors ben gay                          [facility] Adult PT Treatment/Exercise - 10/26/17 0001      Manual Therapy   Manual therapy comments sub-occipital release x 5 min   Soft tissue mobilization IASTM over bil sub-occipitals, and upper trap/ levator scapulae   Myofascial Release fascial rolling/ stretching over bil upper trap/levator  scapulae          Trigger Point Dry Needling - 10/26/17 1529    Consent Given? Yes   Education Handout Provided No   Muscles Treated Upper Body Longissimus   Upper Trapezius Response Twitch reponse elicited   Longissimus Response Twitch response elicited              PT Education - 10/26/17 1521    Education provided Yes   Education Details Reviewed proper range of motion exercies    Person(s) Educated Patient   Methods Explanation;Demonstration;Tactile cues   Comprehension Verbalized understanding;Verbal cues required;Returned demonstration          PT Short Term Goals - 09/21/17 1237      PT SHORT TERM GOAL #1   Title same as LTGs           PT Long Term Goals - 09/28/17 1548      PT LONG TERM GOAL #1   Title Pt will be IND in HEP to improve pain, strength, and posture. TARGET DATE FOR ALL LTGS: 11/02/17   Status On-going     PT LONG TERM GOAL #2   Title Pt will improve NDI score to </=22% to improve quality of life.    Status  Unable to assess     PT LONG TERM GOAL #3   Title Pt will perform cervical ROM in all directions (flex,ext, rot, sidebending) WNL to improve functional mobility.    Status On-going     PT LONG TERM GOAL #4   Title Pt will report cervical pain at worst is </=2/10 in order to perform ADLs.   Status On-going     PT LONG TERM GOAL #5   Title Pt will improve L shoulder strength (flex/ext/abd) to 4+/5 in order to perform ADLs and work activities without pain.   Status On-going               Plan - 10/26/17 1525    Clinical Impression Statement Patient was educated on proper range of motion. he reports he has been strethcing his neck to the sides as hard as possible. Therapy performed TPDN to bilateral c5-c6 paraspinals and bilateral upper traps. He was advised to make another appointment. If he improves we will move forward if he isnt we will send him back to the MD.    Clinical Presentation Stable   Clinical Decision Making  Low   Rehab Potential Good   Clinical Impairments Affecting Rehab Potential see above   PT Frequency 2x / week   PT Duration 6 weeks   PT Treatment/Interventions ADLs/Self Care Home Management;Biofeedback;Cryotherapy;Electrical Stimulation;Iontophoresis 46m/ml Dexamethasone;Moist Heat;Traction;Ultrasound;Neuromuscular re-education;Therapeutic exercise;Therapeutic activities;Gait training;DME Instruction;Patient/family education;Vestibular;Manual techniques;Dry needling   PT Next Visit Plan  Add exercises if able for posture    PT Home Exercise Plan cervical retraction, scapular retraction, UT and LS stretching, rotation    Consulted and Agree with Plan of Care Patient      Patient will benefit from skilled therapeutic intervention in order to improve the following deficits and impairments:  Impaired flexibility, Postural dysfunction, Decreased range of motion, Decreased mobility, Decreased strength, Pain, Impaired sensation, Hypomobility  Visit Diagnosis: Cervicalgia  Muscle weakness (generalized)     Problem List There are no active problems to display for this patient.   DCarney Living11/12/2016, 3:30 PM  [place] [address]: [phone]  Fax:  [phone] Name: Seth FRISKMRN: [id number] of Birth: 309-19-1957     PHYSICAL THERAPY DISCHARGE SUMMARY  Visits from Start of Care: 6  Current functional level related to goals / functional outcomes: See goals   Remaining deficits: unknown   Education / Equipment: HEP, posture, theraband  Plan: Patient agrees to discharge.  Patient goals were not met. Patient is being discharged due to meeting the stated rehab goals.  ?????      Kristoffer Leamon PT, DPT, LAT, ATC  12/12/17  9:13 AM

## 2017-10-31 ENCOUNTER — Encounter: Payer: Self-pay | Admitting: Physical Therapy

## 2018-01-29 ENCOUNTER — Encounter: Payer: Self-pay | Admitting: Adult Health

## 2018-01-29 ENCOUNTER — Ambulatory Visit (INDEPENDENT_AMBULATORY_CARE_PROVIDER_SITE_OTHER): Payer: 59 | Admitting: Adult Health

## 2018-01-29 VITALS — BP 162/79 | HR 62 | Ht 68.0 in | Wt 180.2 lb

## 2018-01-29 DIAGNOSIS — M542 Cervicalgia: Secondary | ICD-10-CM

## 2018-01-29 DIAGNOSIS — R202 Paresthesia of skin: Secondary | ICD-10-CM

## 2018-01-29 MED ORDER — GABAPENTIN 100 MG PO CAPS: 100.0000 mg | ORAL_CAPSULE | Freq: Every day | ORAL | 5 refills | Status: DC

## 2018-01-29 NOTE — Progress Notes (Signed)
PATIENT: JAMIAN ANDUJO DOB: 06-Apr-1956  REASON FOR VISIT: follow up HISTORY FROM: patient  HISTORY OF PRESENT ILLNESS: Mr. Wettstein is a 62 year old male with a history of neck pain.  He returns today for follow-up.  He reports that Flexeril and neuromuscular therapy has not offered him much benefit.  He continues to have bilateral neck pain that radiates down to both shoulders.  He continues to have the numbing sensation on the left cheek the side of the mouth.  He states occasionally during the night he may also have numbness and tingling in the fingertips bilaterally.  The patient has had MRI of the cervical spine that did not show any significant changes that would explain his symptoms.  He returns today for evaluation.  HISTORY Mr. Dumlao is a 62 year old male with a history of neck pain and left facial numbness and tingling. He returns today for follow-up. The patient did have a MRI of the cervical spine that shows some degenerative changes. He reports that he continues to have significant neck pain as well as sensory changes on the left side of the face. He simply states that it feels different when you touch the left side versus the right. He also reports some tingling in the left side of face. He denies any numbness or tingling in extremities. He reports that he often feels very stiff and he has trouble moving to the left and right. Neuromuscular therapy was set up for the patient however he reports that he never got a call about this. He has been taking Flexeril at bedtime he feels that may offer him some benefit but has not resolved his neck pain nor sensory changes.   REVIEW OF SYSTEMS: Out of a complete 14 system review of symptoms, the patient complains only of the following symptoms, and all other reviewed systems are negative.  See HPI  ALLERGIES: Allergies  Allergen Reactions  . Codeine Itching and Nausea Only    HOME MEDICATIONS: Outpatient Medications Prior to Visit    Medication Sig Dispense Refill  . CALCIUM PO Take 1 tablet by mouth daily.    . Cholecalciferol (VITAMIN D PO) Take 1 tablet by mouth daily.    Marland Kitchen CINNAMON PO Take 1 tablet by mouth 2 (two) times daily.    . colchicine 0.6 MG tablet Take 0.6 mg by mouth daily.    . cyclobenzaprine (FLEXERIL) 10 MG tablet Take 1 tablet (10 mg total) by mouth 3 (three) times daily. 90 tablet 1  . fluticasone (FLONASE) 50 MCG/ACT nasal spray Place 2 sprays into both nostrils daily.    Marland Kitchen GARLIC PO Take 1 tablet by mouth 2 (two) times daily.    Marland Kitchen HYDROcodone-acetaminophen (NORCO/VICODIN) 5-325 MG tablet Take 1-2 tablets by mouth every 4 (four) hours as needed for severe pain. 10 tablet 0  . Multiple Vitamin (MULTIVITAMIN WITH MINERALS) TABS tablet Take 1 tablet by mouth daily.    . naproxen (NAPROSYN) 500 MG tablet Take 500 mg by mouth as needed.    Marland Kitchen OVER THE COUNTER MEDICATION Take 5 drops by mouth daily. Mixes Frankincense and Thieves in liquid to drink daily    . TURMERIC PO Take 1 tablet by mouth daily.     No facility-administered medications prior to visit.     PAST MEDICAL HISTORY: No past medical history on file.  PAST SURGICAL HISTORY: Past Surgical History:  Procedure Laterality Date  . BACK SURGERY     Lower back surgery x2  . CATARACT  EXTRACTION W/ INTRAOCULAR LENS  IMPLANT, BILATERAL    . eyelid lift  1   [facility]  . FOOT SURGERY      FAMILY HISTORY: No family history on file.  SOCIAL HISTORY: Social History   Socioeconomic History  . Marital status: Married    Spouse name: Olegario MessierKathy   . Number of children: 1  . Years of education: 7616  . Highest education level: Not on file  Social Needs  . Financial resource strain: Not on file  . Food insecurity - worry: Not on file  . Food insecurity - inability: Not on file  . Transportation needs - medical: Not on file  . Transportation needs - non-medical: Not on file  Occupational History  . Occupation: Self employed   Tobacco Use  . Smoking status: Never Smoker  . Smokeless tobacco: Never Used  Substance and Sexual Activity  . Alcohol use: Yes    Comment: a beer every once in a while  . Drug use: Not on file  . Sexual activity: Not on file  Other Topics Concern  . Not on file  Social History Narrative   Lives with wife   Caffeine use: 1 5-hour energy drink per day   Right handed      PHYSICAL EXAM  Vitals:   01/29/18 1401  BP: (!) 162/79  Pulse: 62  Weight: 180 lb 3.2 oz (81.7 kg)  Height: 5\' 8"  (1.727 m)   Body mass index is 27.4 kg/m.  Generalized: Well developed, in no acute distress   Neurological examination  Mentation: Alert oriented to time, place, history taking. Follows all commands speech and language fluent Cranial nerve II-XII: Pupils were equal round reactive to light. Extraocular movements were full, visual field were full on confrontational test. Facial sensation and strength were normal. Uvula tongue midline. Head turning and shoulder shrug  were normal and symmetric. Motor: The motor testing reveals 5 over 5 strength of all 4 extremities. Good symmetric motor tone is noted throughout.  Sensory: Sensory testing is intact to soft touch on all 4 extremities. No evidence of extinction is noted.  Coordination: Cerebellar testing reveals good finger-nose-finger and heel-to-shin bilaterally.  Gait and station: Gait is normal. Tandem gait is normal. Romberg is negative. No drift is seen.  Reflexes: Deep tendon reflexes are symmetric and normal bilaterally.   DIAGNOSTIC DATA (LABS, IMAGING, TESTING) - I reviewed patient records, labs, notes, testing and imaging myself where available.  Lab Results  Component Value Date   WBC 11.5 (H) 04/29/2017   HGB 13.9 04/29/2017   HCT 41.0 04/29/2017   MCV 88.5 04/29/2017   PLT 215 04/29/2017      Component Value Date/Time   NA 138 04/29/2017 1553   K 4.1 04/29/2017 1553   CL 104 04/29/2017 1553   CO2 25 04/29/2017 1535    GLUCOSE 220 (H) 04/29/2017 1553   BUN 16 04/29/2017 1553   CREATININE 0.60 (L) 04/29/2017 1553   CALCIUM 9.3 04/29/2017 1535   GFRNONAA >60 04/29/2017 1535   GFRAA >60 04/29/2017 1535   June 22, 2017 MRI cervical spine without contrast:  IMPRESSION:  Abnormal MRI cervical spine (without) demonstrating: 1. At C5-6: disc bulging and uncovertebral joint hypertrophy with moderate right foraminal stenosis  2. At C6-7: disc bulging with moderate left foraminal stenosis  3. At C2-3: uncovertebral joint hypertrophy with mild left biforaminal stenosis  4. At C3-4: uncovertebral joint hypertrophy with mild right foraminal stenosis  MRI of the brain  without contrast Apr 29, 2017: IMPRESSION: 1. No acute finding. 2. Moderate cerebral white matter disease that is nonspecific and described above.  CTA neck with and without contrast Apr 29, 2017: IMPRESSION: 1. No acute finding or specific explanation for symptoms. No evidence of dissection. 2. Moderate cervical carotid atherosclerosis without flow limiting stenosis. 3. Mild intracranial atherosclerosis. 4. Motion degraded at the level of the neck. 5. Advanced disc and facet degeneration. No acute superimposed finding to explain neck pain. 6. Mild to moderate cerebral white matter disease, nonspecific.   ASSESSMENT AND PLAN 62 y.o. year old male  has no past medical history on file. here with:  1.  Neck pain 2.  Sensory changes- left face cheek  The patient has not gained any benefit from neuromuscular therapy or Flexeril.  I will try the patient on gabapentin 100 mg at bedtime to see if this offers any benefit with his neck pain.  I have reviewed potential side effects with the patient.  He is advised that if his symptoms worsen or he develops new symptoms he should let us know.  He will follow-up in 6 months or sooner if needed.      Butch Penny, MSN, NP-C 01/29/2018, 2:48 PM [facility] [address] [place], [place] [address] [phone]

## 2018-01-29 NOTE — Progress Notes (Signed)
I have read the note, and I agree with the clinical assessment and plan.  Humphrey Guerreiro K Caoilainn Sacks   

## 2018-01-29 NOTE — Patient Instructions (Signed)
Your Plan:  Stop Flexeril Start Gabapentin 100 mg at bedtime If your symptoms worsen or you develop new symptoms please let us know.   Thank you for coming to see [place] at [facility]. I hope we have been able to provide you high quality care today.  You may receive a patient satisfaction survey over the next few weeks. We would appreciate your feedback and comments so that we may continue to improve ourselves and the health of our patients.  Gabapentin capsules or tablets What is this medicine? GABAPENTIN (GA ba pen tin) is used to control partial seizures in adults with epilepsy. It is also used to treat certain types of nerve pain. This medicine may be used for other purposes; ask your health care provider or pharmacist if you have questions. COMMON BRAND NAME(S): Active-PAC with Gabapentin, Gabarone, Neurontin What should I tell my health care provider before I take this medicine? They need to know if you have any of these conditions: -kidney disease -suicidal thoughts, plans, or attempt; a previous suicide attempt by you or a family member -an unusual or allergic reaction to gabapentin, other medicines, foods, dyes, or preservatives -pregnant or trying to get pregnant -breast-feeding How should I use this medicine? Take this medicine by mouth with a glass of water. Follow the directions on the prescription label. You can take it with or without food. If it upsets your stomach, take it with food.Take your medicine at regular intervals. Do not take it more often than directed. Do not stop taking except on your doctor's advice. If you are directed to break the 600 or 800 mg tablets in half as part of your dose, the extra half tablet should be used for the next dose. If you have not used the extra half tablet within 28 days, it should be thrown away. A special MedGuide will be given to you by the pharmacist with each prescription and refill. Be sure to read this information  carefully each time. Talk to your pediatrician regarding the use of this medicine in children. Special care may be needed. Overdosage: If you think you have taken too much of this medicine contact a poison control center or emergency room at once. NOTE: This medicine is only for you. Do not share this medicine with others. What if I miss a dose? If you miss a dose, take it as soon as you can. If it is almost time for your next dose, take only that dose. Do not take double or extra doses. What may interact with this medicine? Do not take this medicine with any of the following medications: -other gabapentin products This medicine may also interact with the following medications: -alcohol -antacids -antihistamines for allergy, cough and cold -certain medicines for anxiety or sleep -certain medicines for depression or psychotic disturbances -homatropine; hydrocodone -naproxen -narcotic medicines (opiates) for pain -phenothiazines like chlorpromazine, mesoridazine, prochlorperazine, thioridazine This list may not describe all possible interactions. Give your health care provider a list of all the medicines, herbs, non-prescription drugs, or dietary supplements you use. Also tell them if you smoke, drink alcohol, or use illegal drugs. Some items may interact with your medicine. What should I watch for while using this medicine? Visit your doctor or health care professional for regular checks on your progress. You may want to keep a record at home of how you feel your condition is responding to treatment. You may want to share this information with your doctor or health care professional at each  visit. You should contact your doctor or health care professional if your seizures get worse or if you have any new types of seizures. Do not stop taking this medicine or any of your seizure medicines unless instructed by your doctor or health care professional. Stopping your medicine suddenly can increase  your seizures or their severity. Wear a medical identification bracelet or chain if you are taking this medicine for seizures, and carry a card that lists all your medications. You may get drowsy, dizzy, or have blurred vision. Do not drive, use machinery, or do anything that needs mental alertness until you know how this medicine affects you. To reduce dizzy or fainting spells, do not sit or stand up quickly, especially if you are an older patient. Alcohol can increase drowsiness and dizziness. Avoid alcoholic drinks. Your mouth may get dry. Chewing sugarless gum or sucking hard candy, and drinking plenty of water will help. The use of this medicine may increase the chance of suicidal thoughts or actions. Pay special attention to how you are responding while on this medicine. Any worsening of mood, or thoughts of suicide or dying should be reported to your health care professional right away. Women who become pregnant while using this medicine may enroll in the [place] Antiepileptic Drug Pregnancy Registry by calling [phone]. This registry collects information about the safety of antiepileptic drug use during pregnancy. What side effects may I notice from receiving this medicine? Side effects that you should report to your doctor or health care professional as soon as possible: -allergic reactions like skin rash, itching or hives, swelling of the face, lips, or tongue -worsening of mood, thoughts or actions of suicide or dying Side effects that usually do not require medical attention (report to your doctor or health care professional if they continue or are bothersome): -constipation -difficulty walking or controlling muscle movements -dizziness -nausea -slurred speech -tiredness -tremors -weight gain This list may not describe all possible side effects. Call your doctor for medical advice about side effects. You may report side effects to FDA at 1-800-FDA-1088. Where should I  keep my medicine? Keep out of reach of children. This medicine may cause accidental overdose and death if it taken by other adults, children, or pets. Mix any unused medicine with a substance like cat litter or coffee grounds. Then throw the medicine away in a sealed container like a sealed bag or a coffee can with a lid. Do not use the medicine after the expiration date. Store at room temperature between 15 and 30 degrees C (59 and 86 degrees F). NOTE: This sheet is a summary. It may not cover all possible information. If you have questions about this medicine, talk to your doctor, pharmacist, or health care provider.  2018 Elsevier/Gold Standard (2014-02-07 15:26:50)

## 2018-05-17 IMAGING — MR MR HEAD W/O CM
10 of 12 series · 41 of 48 positions shown · non-contrast
Comparison: Head CT and CTA from earlier today

CLINICAL DATA: Posterior headache with neck pain and left facial
numbness.

EXAM:
MRI HEAD WITHOUT CONTRAST
TECHNIQUE: Multiplanar, multiecho pulse sequences of the brain and surrounding
structures were obtained without intravenous contrast.

[Series 4: T1 · sagittal · 5.0mm · 0.47mm/px · 2 of 27 slices shown]
[im 1/27]
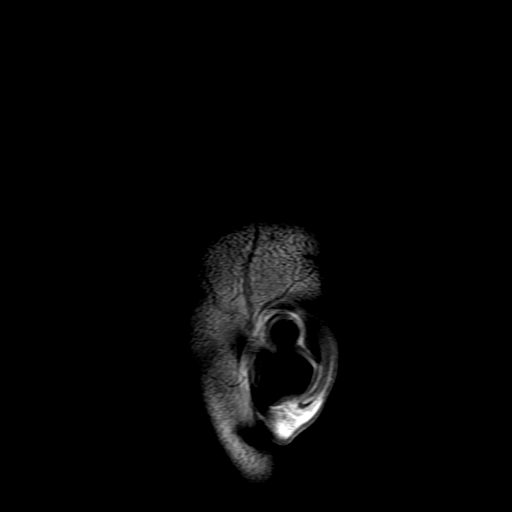
[im 27/27]
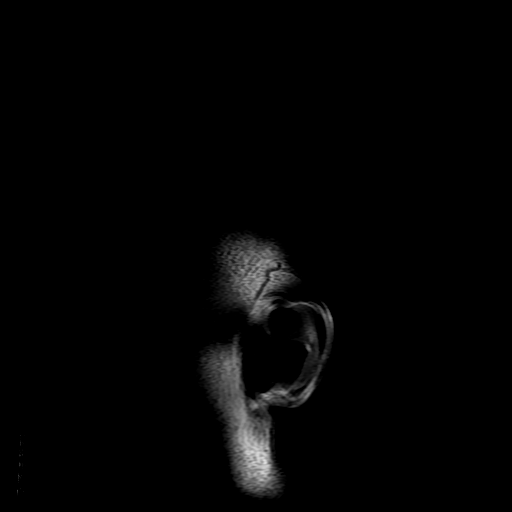

[Series 5: DWI · axial · 3.0mm · 1.09mm/px · z∈[-80,+82]mm · 9 of 110 slices shown (1 of 6)]
[im 1/110]
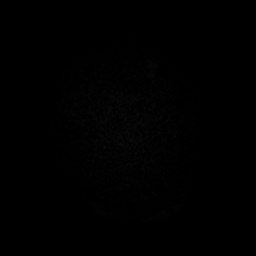
[im 14/110]
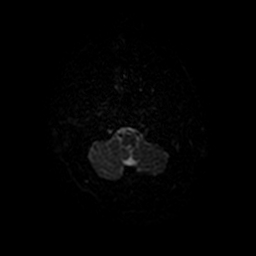
[im 28/110]
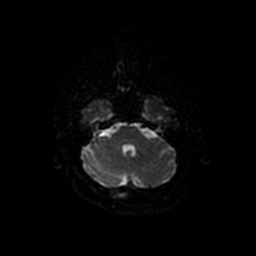
[im 41/110]
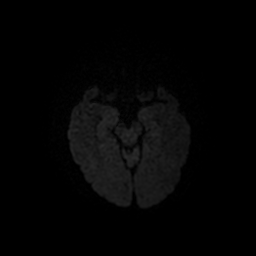
[im 55/110]
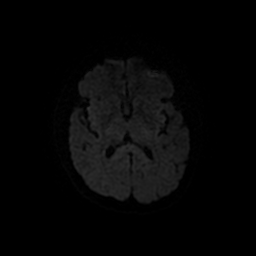
[im 69/110]
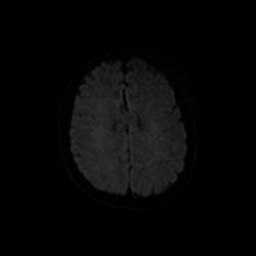
[im 82/110]
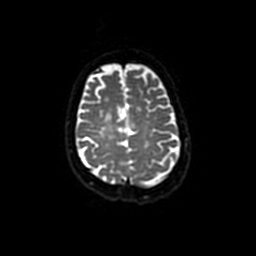
[im 96/110]
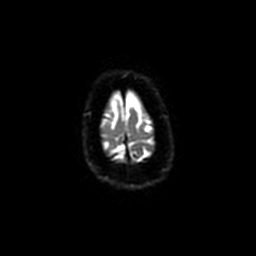
[im 110/110]
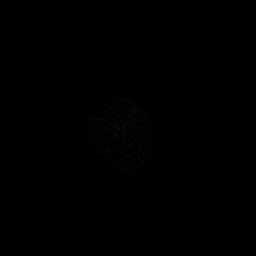

[Series 6: DWI · coronal · 5.0mm · 1.09mm/px · 7 of 78 slices shown (2 of 6)]
[im 1/78]
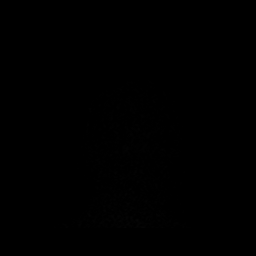
[im 13/78]
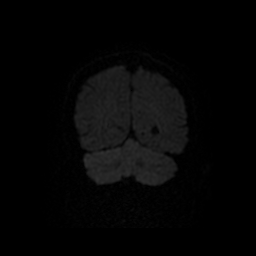
[im 26/78]
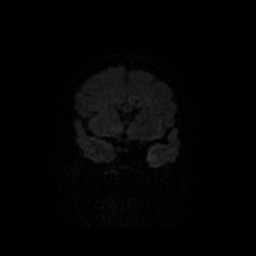
[im 39/78]
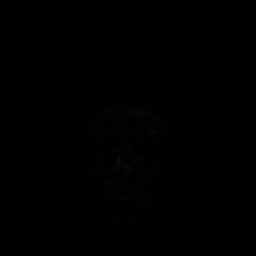
[im 52/78]
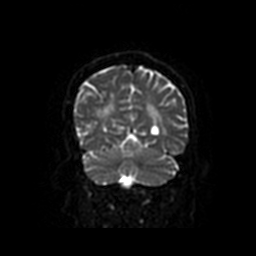
[im 65/78]
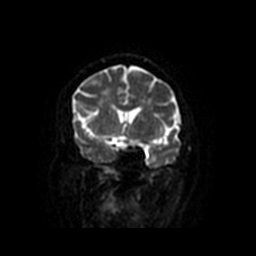
[im 78/78]
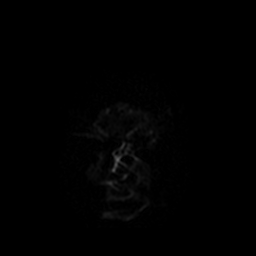

[Series 7: T2 · axial · 5.0mm · 0.43mm/px · z∈[-95,+66]mm · 2 of 24 slices shown (1 of 2)]
[im 1/24]
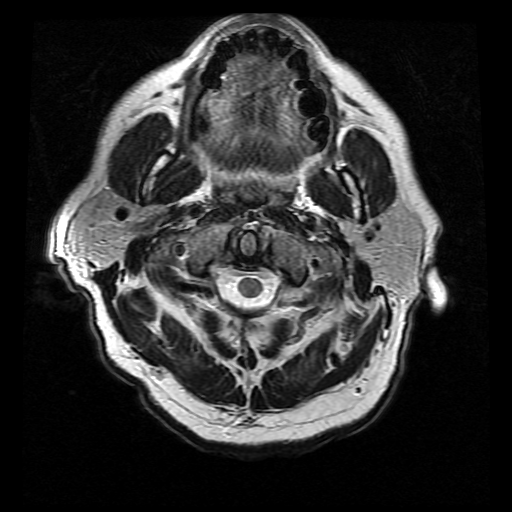
[im 24/24]
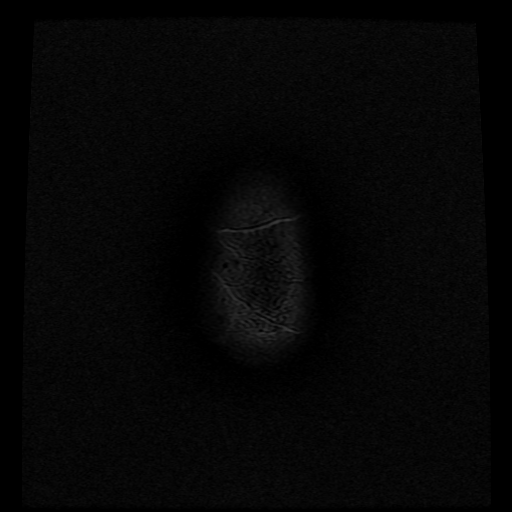

[Series 8: FLAIR · axial · 3.0mm · 0.43mm/px · z∈[-76,+79]mm · 2 of 27 slices shown]
[im 1/27]
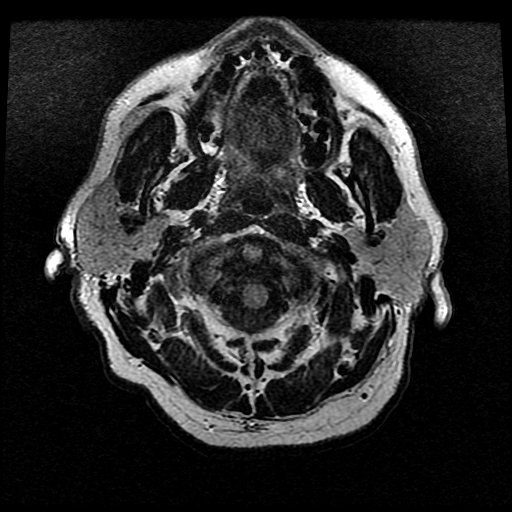
[im 27/27]
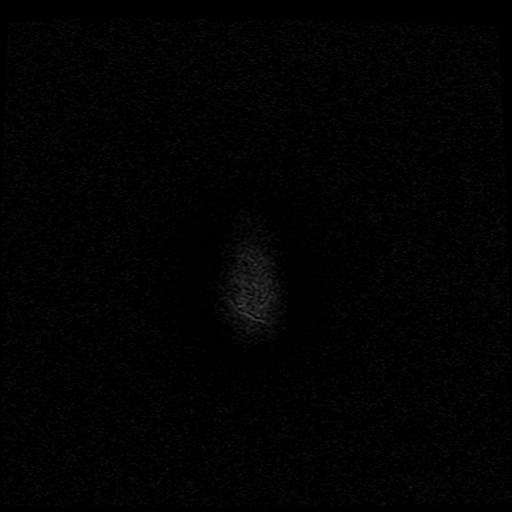

[Series 11: T2 · coronal · 5.0mm · 0.45mm/px · 3 of 30 slices shown (2 of 2)]
[im 1/30]
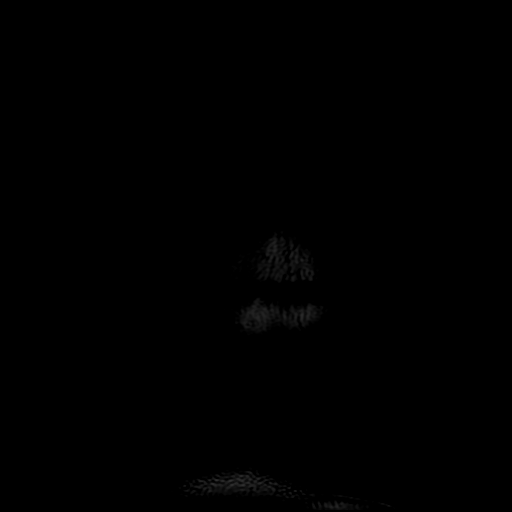
[im 15/30]
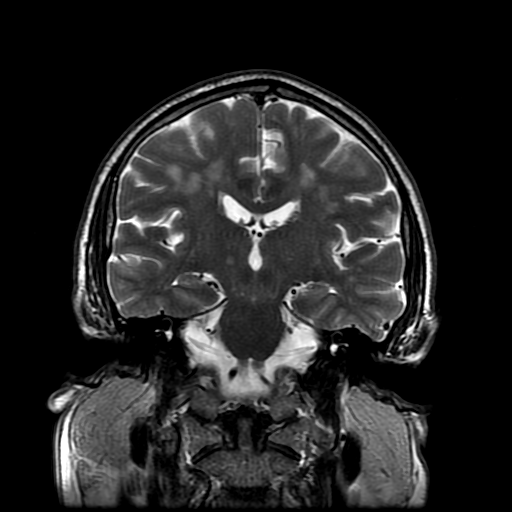
[im 30/30]
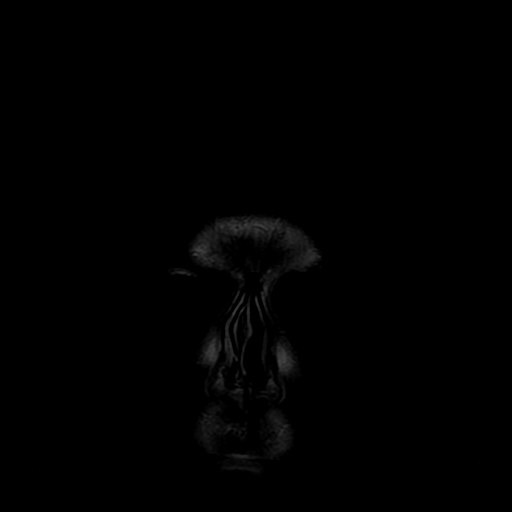

[Series 500: DWI · axial · 3.0mm · 1.09mm/px · z∈[-80,+79]mm · 5 of 54 slices shown (3 of 6)]
[im 1/54]
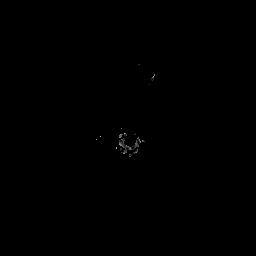
[im 14/54]
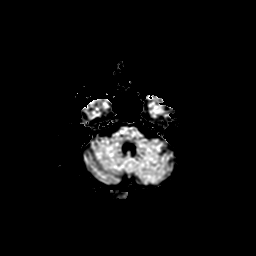
[im 27/54]
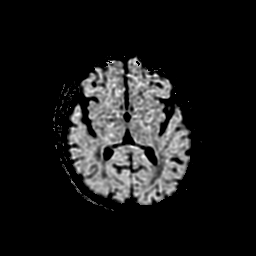
[im 40/54]
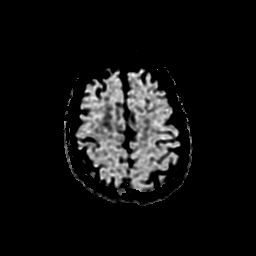
[im 54/54]
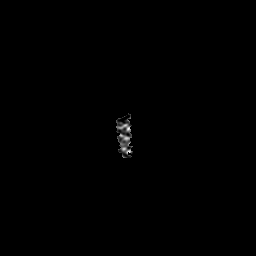

[Series 501: DWI · axial · 3.0mm · 1.09mm/px · z∈[-80,+79]mm · 5 of 54 slices shown (4 of 6)]
[im 1/54]
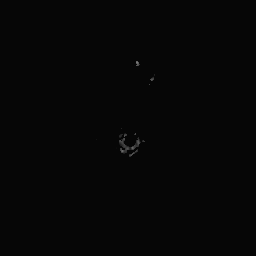
[im 14/54]
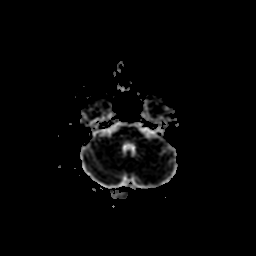
[im 27/54]
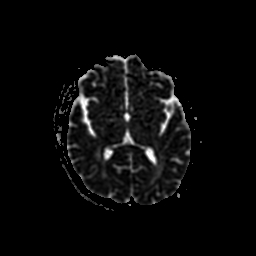
[im 40/54]
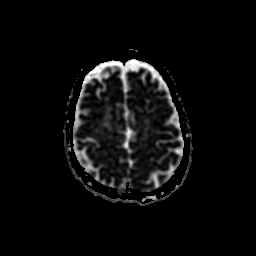
[im 54/54]
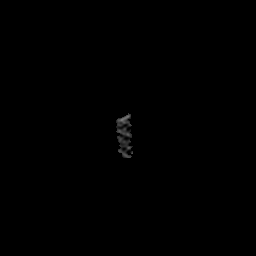

[Series 600: DWI · coronal · 5.0mm · 1.09mm/px · 3 of 38 slices shown (5 of 6)]
[im 1/38]
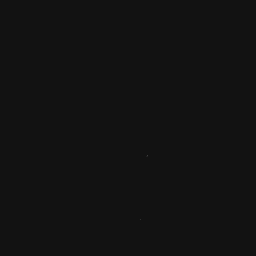
[im 19/38]
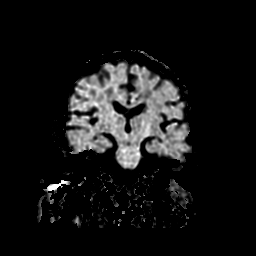
[im 38/38]
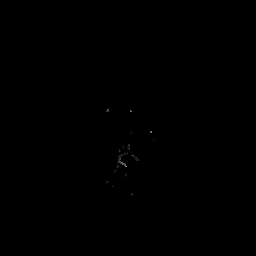

[Series 601: DWI · coronal · 5.0mm · 1.09mm/px · 3 of 38 slices shown (6 of 6)]
[im 1/38]
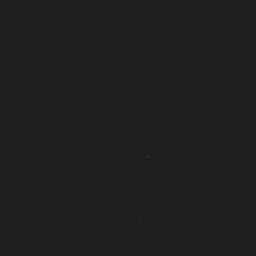
[im 19/38]
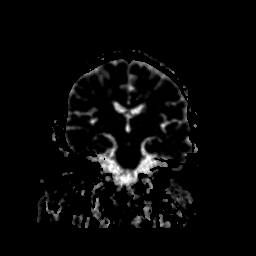
[im 38/38]
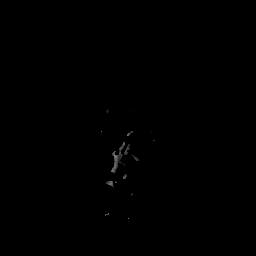

[41 of 48 positions shown; findings below may reference images not displayed]

FINDINGS: Brain: No acute infarction, hemorrhage, hydrocephalus, extra-axial
collection or mass lesion.

Moderate cerebral white matter disease with signal abnormality seen
from the juxta cortical to periventricular white matter. This
pattern is nonspecific and often attributed to chronic small vessel
ischemia. Multiple inflammatory processes and demyelinating
syndromes can also give this appearance. It is notable but there is
no infratentorial signal abnormalities, ovoid radiating pattern,
corpus callosum involvement, or black holes as often seen with
multiple sclerosis.

No abnormality seen along the course of the trigeminal nerves.

Vascular: Major flow voids are preserved.

Skull and upper cervical spine: Negative for marrow lesion.

Sinuses/Orbits: Mild mucosal thickening in the paranasal sinuses,
not to a degree that would explain left facial symptoms.
IMPRESSION: 1. No acute finding.
2. Moderate cerebral white matter disease that is nonspecific and
described above.

## 2018-05-17 IMAGING — CT CT ANGIO NECK
2 of 11 series · 7 of 33 positions shown · IV contrast (isovue)
Comparison: None.

CLINICAL DATA: Posterior headache.  Neck pain and facial numbness.

Patient reports left facial paresthesias for 1 week and neck pain
and stiffness for 2 days.
EXAM:
CT ANGIOGRAPHY HEAD AND NECK
TECHNIQUE: Multidetector CT imaging of the head and neck was performed using
the standard protocol during bolus administration of intravenous
contrast. Multiplanar CT image reconstructions and MIPs were
obtained to evaluate the vascular anatomy. Carotid stenosis
measurements (when applicable) are obtained utilizing NASCET
criteria, using the distal internal carotid diameter as the
denominator.
CONTRAST:  100 cc Isovue 370 intravenous

[Series 5: headangio 2.0 h31f · axial · 0.45mm/px · z∈[-270,-156]mm · 2 of 173 slices shown]
[im 58/173  soft-tissue]
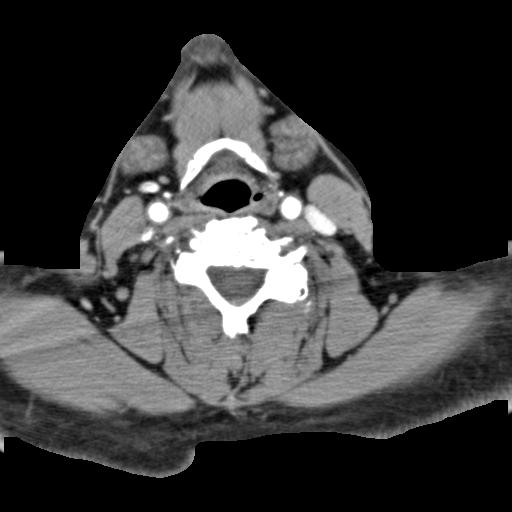
[im 115/173  soft-tissue]
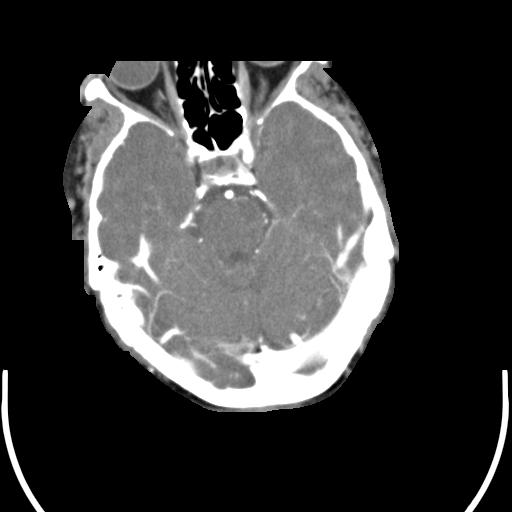

[Series 10: axial · axial · 0.39mm/px · z∈[-325,-97]mm · 5 of 343 slices shown]
[im 58/343  soft-tissue]
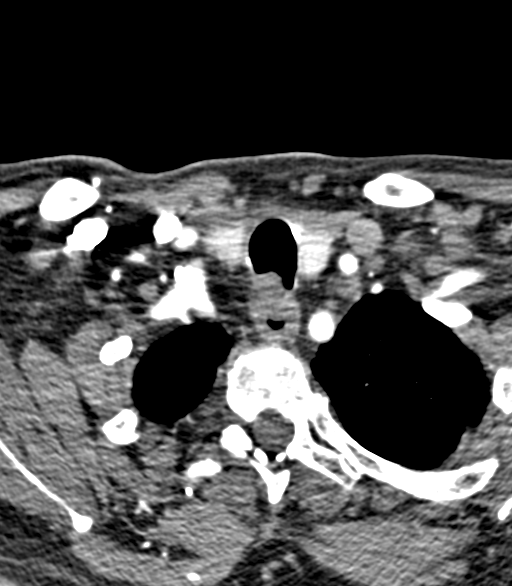
[im 115/343  bone]
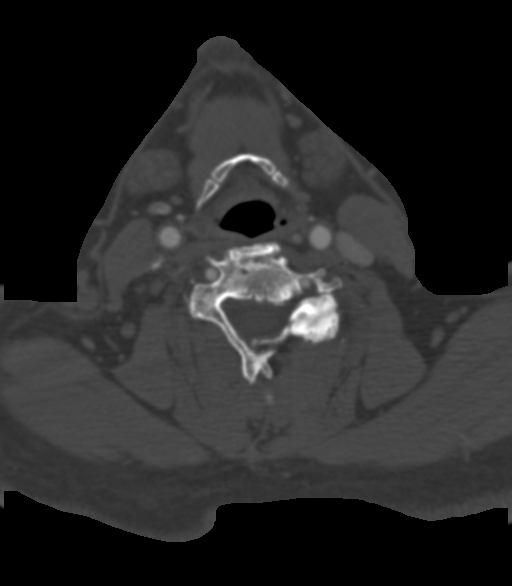
[im 172/343  soft-tissue]
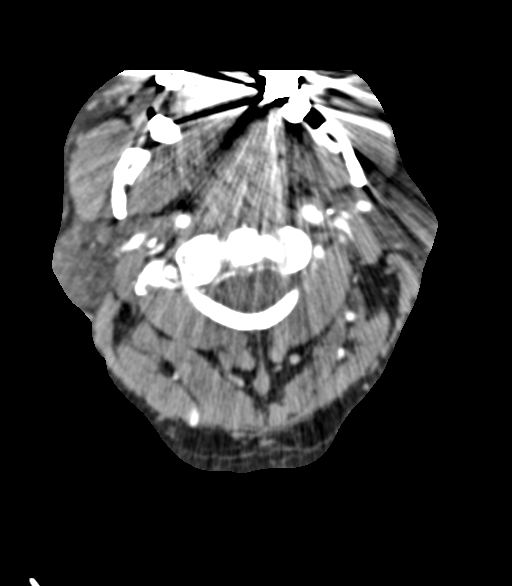
[im 229/343  bone]
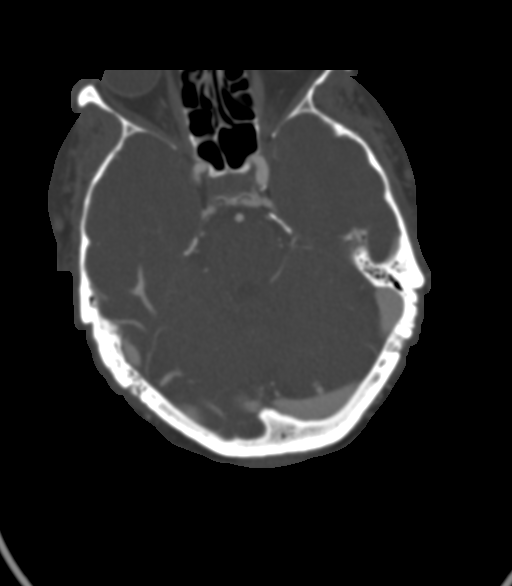
[im 286/343  soft-tissue]
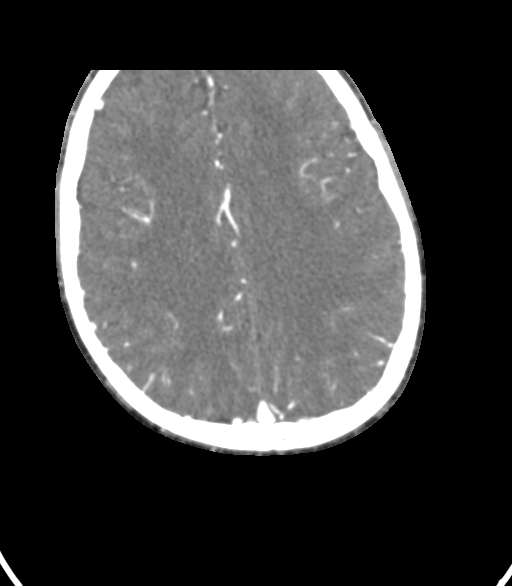

[7 of 33 positions shown; findings below may reference images not displayed]

FINDINGS: CT HEAD FINDINGS

Brain: No evidence of acute infarction, hemorrhage, hydrocephalus,
extra-axial collection or mass lesion/mass effect. Mild to moderate
patchy low-density in the cerebral white matter.

Vascular: See below

Skull: No acute or aggressive finding. Band of scar-like density
along the posterior scalp.

Sinuses: Clear

Orbits: Bilateral cataract resection.  No acute finding.

Review of the MIP images confirms the above findings

CTA NECK FINDINGS

Aortic arch: Mild atherosclerotic calcification. Three vessel
branching.

Right carotid system: Moderate calcified and noncalcified plaque at
the common carotid bifurcation with no significant stenosis.
Intermittently distorted ICA due to motion and streak artifact. No
suspected dissection. No beading suspected.

Left carotid system: Moderate calcified plaque at the common carotid
bifurcation without stenosis or ulceration. Intermittent motion and
streak artifact over the ICA without suspected superimposed
dissection or beading.

Vertebral arteries: No brachiocephalic or subclavian flow limiting
stenosis. Intermittently seen atheromatous calcification of the
bilateral cervical vertebral arteries. No flow limiting stenosis. No
indication of superimposed dissection.

Skeleton: Diffuse disc degeneration and endplate spurring, advanced.
There is asymmetric bulky left facet arthropathy with C4-5 ankylosis
and C2-3 facet arthritis. Advanced asymmetric right C7-T1 facet
arthritis. Remote T2 superior endplate fracture. No acute finding to
explain neck pain

Other neck: No noted inflammation or gross mass lesion.

Upper chest: Negative

Review of the MIP images confirms the above findings

CTA HEAD FINDINGS

Anterior circulation: Atheromatous changes on the bilateral carotid
siphons without flow limiting stenosis. No major branch occlusion or
flow limiting stenosis. Negative for aneurysm.

Posterior circulation: Right dominant vertebral artery. No unusual
vertebrobasilar branching. The vertebral and basilar arteries are
smooth and widely patent. Good patency of bilateral PCAs.

Venous sinuses: Patent

Anatomic variants: None

Delayed phase: No abnormal intracranial enhancement.

Review of the MIP images confirms the above findings
IMPRESSION: 1. No acute finding or specific explanation for symptoms. No
evidence of dissection.
2. Moderate cervical carotid atherosclerosis without flow limiting
stenosis.
3. Mild intracranial atherosclerosis.
4. Motion degraded at the level of the neck.
5. Advanced disc and facet degeneration. No acute superimposed
finding to explain neck pain.
6. Mild to moderate cerebral white matter disease, nonspecific.

## 2018-06-07 ENCOUNTER — Other Ambulatory Visit: Payer: Self-pay | Admitting: Nurse Practitioner

## 2018-06-07 ENCOUNTER — Ambulatory Visit
Admission: RE | Admit: 2018-06-07 | Discharge: 2018-06-07 | Disposition: A | Discharge status: Home / Self Care | Payer: 59 | Source: Ambulatory Visit | Attending: Nurse Practitioner | Admitting: Nurse Practitioner

## 2018-06-07 DIAGNOSIS — R05 Cough: Secondary | ICD-10-CM

## 2018-06-07 DIAGNOSIS — R059 Cough, unspecified: Secondary | ICD-10-CM

## 2018-08-01 ENCOUNTER — Encounter: Payer: Self-pay | Admitting: Adult Health

## 2018-08-01 ENCOUNTER — Ambulatory Visit (INDEPENDENT_AMBULATORY_CARE_PROVIDER_SITE_OTHER): Payer: BLUE CROSS/BLUE SHIELD | Admitting: Adult Health

## 2018-08-01 VITALS — BP 162/90 | HR 65 | Ht 68.0 in | Wt 186.0 lb

## 2018-08-01 DIAGNOSIS — M47812 Spondylosis without myelopathy or radiculopathy, cervical region: Secondary | ICD-10-CM | POA: Diagnosis not present

## 2018-08-01 MED ORDER — TRAMADOL HCL 50 MG PO TABS: 25.0000 mg | ORAL_TABLET | Freq: Four times a day (QID) | ORAL | 0 refills | Status: DC | PRN

## 2018-08-01 NOTE — Progress Notes (Signed)
I have read the note, and I agree with the clinical assessment and plan.  Charles K Willis   

## 2018-08-01 NOTE — Progress Notes (Signed)
PATIENT: Seth Adams DOB: 12/03/1956  REASON FOR VISIT: follow up HISTORY FROM: patient  HISTORY OF PRESENT ILLNESS: Today 08/01/18:  [name] is a 62 year old male with a history of neck pain.  He returns today for follow-up.  At the last visit he was started on gabapentin 100 mg at bedtime.  He reports that he was unable to tolerate this.  He states that it made him feel loopy.  He states that his neck pain has gotten worse here recently due to physical labor.  He states that he has been very busy with his work and that has aggravated his neck pain.  He states in the past he did do physical therapy he reported that he did not find it beneficial however now he feels that he may need to repeat that.  He does note that he does not want to be on sedating medication if possible.  He returns today for an evaluation.  HISTORYMr. Seth Adams is a 62 year old male with a history of neck pain.  He returns today for follow-up.  He reports that Flexeril and neuromuscular therapy has not offered him much benefit.  He continues to have bilateral neck pain that radiates down to both shoulders.  He continues to have the numbing sensation on the left cheek the side of the mouth.  He states occasionally during the night he may also have numbness and tingling in the fingertips bilaterally.  The patient has had MRI of the cervical spine that did not show any significant changes that would explain his symptoms.  He returns today for evaluation.     REVIEW OF SYSTEMS: Out of a complete 14 system review of symptoms, the patient complains only of the following symptoms, and all other reviewed systems are negative.   See HPI  ALLERGIES: Allergies  Allergen Reactions  . Codeine Itching and Nausea Only    HOME MEDICATIONS: Outpatient Medications Prior to Visit  Medication Sig Dispense Refill  . CALCIUM PO Take 1 tablet by mouth daily.    . Cholecalciferol (VITAMIN D PO) Take 1 tablet by mouth daily.    Marland Kitchen.  CINNAMON PO Take 1 tablet by mouth 2 (two) times daily.    . colchicine 0.6 MG tablet Take 0.6 mg by mouth daily.    . cyclobenzaprine (FLEXERIL) 10 MG tablet Take 1 tablet (10 mg total) by mouth 3 (three) times daily. 90 tablet 1  . fluticasone (FLONASE) 50 MCG/ACT nasal spray Place 2 sprays into both nostrils daily.    Marland Kitchen. gabapentin (NEURONTIN) 100 MG capsule Take 1 capsule (100 mg total) by mouth at bedtime. 30 capsule 5  . GARLIC PO Take 1 tablet by mouth 2 (two) times daily.    Marland Kitchen. HYDROcodone-acetaminophen (NORCO/VICODIN) 5-325 MG tablet Take 1-2 tablets by mouth every 4 (four) hours as needed for severe pain. 10 tablet 0  . Multiple Vitamin (MULTIVITAMIN WITH MINERALS) TABS tablet Take 1 tablet by mouth daily.    . naproxen (NAPROSYN) 500 MG tablet Take 500 mg by mouth as needed.    Marland Kitchen. OVER THE COUNTER MEDICATION Take 5 drops by mouth daily. Mixes Frankincense and Thieves in liquid to drink daily    . TURMERIC PO Take 1 tablet by mouth daily.     No facility-administered medications prior to visit.     PAST MEDICAL HISTORY: History reviewed. No pertinent past medical history.  PAST SURGICAL HISTORY: Past Surgical History:  Procedure Laterality Date  . BACK SURGERY  Lower back surgery x2  . CATARACT EXTRACTION W/ INTRAOCULAR LENS  IMPLANT, BILATERAL    . eyelid lift  1   2019 [facility]  . FOOT SURGERY      FAMILY HISTORY: History reviewed. No pertinent family history.  SOCIAL HISTORY: Social History   Socioeconomic History  . Marital status: Married    Spouse name: Olegario Messier   . Number of children: 1  . Years of education: 43  . Highest education level: Not on file  Occupational History  . Occupation: Self employed  Social Needs  . Financial resource strain: Not on file  . Food insecurity:    Worry: Not on file    Inability: Not on file  . Transportation needs:    Medical: Not on file    Non-medical: Not on file  Tobacco Use  . Smoking status: Never  Smoker  . Smokeless tobacco: Never Used  Substance and Sexual Activity  . Alcohol use: Yes    Comment: a beer every once in a while  . Drug use: Not on file  . Sexual activity: Not on file  Lifestyle  . Physical activity:    Days per week: Not on file    Minutes per session: Not on file  . Stress: Not on file  Relationships  . Social connections:    Talks on phone: Not on file    Gets together: Not on file    Attends religious service: Not on file    Active member of club or organization: Not on file    Attends meetings of clubs or organizations: Not on file    Relationship status: Not on file  . Intimate partner violence:    Fear of current or ex partner: Not on file    Emotionally abused: Not on file    Physically abused: Not on file    Forced sexual activity: Not on file  Other Topics Concern  . Not on file  Social History Narrative   Lives with wife   Caffeine use: 1 5-hour energy drink per day   Right handed      PHYSICAL EXAM  Vitals:   08/01/18 1251  BP: (!) 162/90  Pulse: 65  Weight: 186 lb (84.4 kg)  Height: 5\' 8"  (1.727 m)   Body mass index is 28.28 kg/m.  Generalized: Well developed, in no acute distress   Neurological examination  Mentation: Alert oriented to time, place, history taking. Follows all commands speech and language fluent Cranial nerve II-XII: Pupils were equal round reactive to light. Extraocular movements were full, visual field were full on confrontational test. Facial sensation and strength were normal. Uvula tongue midline. Head turning and shoulder shrug  were normal and symmetric. Motor: The motor testing reveals 5 over 5 strength of all 4 extremities. Good symmetric motor tone is noted throughout.  Sensory: Sensory testing is intact to soft touch on all 4 extremities. No evidence of extinction is noted.  Coordination: Cerebellar testing reveals good finger-nose-finger and heel-to-shin bilaterally.  Gait and station: Gait is  normal. Tandem gait is normal. Romberg is negative. No drift is seen.  Reflexes: Deep tendon reflexes are symmetric and normal bilaterally.   DIAGNOSTIC DATA (LABS, IMAGING, TESTING) - I reviewed patient records, labs, notes, testing and imaging myself where available.  Lab Results  Component Value Date   WBC 11.5 (H) 04/29/2017   HGB 13.9 04/29/2017   HCT 41.0 04/29/2017   MCV 88.5 04/29/2017   PLT 215 04/29/2017  Component Value Date/Time   NA 138 04/29/2017 1553   K 4.1 04/29/2017 1553   CL 104 04/29/2017 1553   CO2 25 04/29/2017 1535   GLUCOSE 220 (H) 04/29/2017 1553   BUN 16 04/29/2017 1553   CREATININE 0.60 (L) 04/29/2017 1553   CALCIUM 9.3 04/29/2017 1535   GFRNONAA >60 04/29/2017 1535   GFRAA >60 04/29/2017 1535      ASSESSMENT AND PLAN 62 y.o. year old male  has no past medical history on file. here with:  1.  Neck pain  The patient will continue using Flexeril as needed.  I provided patient with a small prescription of tramadol to use for moderate to severe pain.  Also put a referral in to physical therapy for neuromuscular rehab.  I have advised that if his symptoms worsen or he develops new symptoms he should let us know.  He will follow-up in 6 months or sooner if needed.  I spent 15 minutes with the patient. 50% of this time was spent discussing his medication regimen he reports that he still has Flexeril but does not take it very often.   Butch Penny, MSN, NP-C 08/01/2018, 1:48 PM [facility] [address] [place], [place] [address] [phone]

## 2018-08-01 NOTE — Patient Instructions (Addendum)
Your Plan:  Continue flexeril if needed Try tramadol every 6 hours for moderate to severe pain  Refer to physical therapy   Thank you for coming to see [place] at [facility]. I hope we have been able to provide you high quality care today.  You may receive a patient satisfaction survey over the next few weeks. We would appreciate your feedback and comments so that we may continue to improve ourselves and the health of our patients.  Tramadol tablets What is this medicine? TRAMADOL (TRA ma dole) is a pain reliever. It is used to treat moderate to severe pain in adults. This medicine may be used for other purposes; ask your health care provider or pharmacist if you have questions. COMMON BRAND NAME(S): Ultram What should I tell my health care provider before I take this medicine? They need to know if you have any of these conditions: -brain tumor -depression -drug abuse or addiction -head injury -if you frequently drink alcohol containing drinks -kidney disease or trouble passing urine -liver disease -lung disease, asthma, or breathing problems -seizures or epilepsy -suicidal thoughts, plans, or attempt; a previous suicide attempt by you or a family member -an unusual or allergic reaction to tramadol, codeine, other medicines, foods, dyes, or preservatives -pregnant or trying to get pregnant -breast-feeding How should I use this medicine? Take this medicine by mouth with a full glass of water. Follow the directions on the prescription label. You can take it with or without food. If it upsets your stomach, take it with food. Do not take your medicine more often than directed. A special MedGuide will be given to you by the pharmacist with each prescription and refill. Be sure to read this information carefully each time. Talk to your pediatrician regarding the use of this medicine in children. Special care may be needed. Overdosage: If you think you have taken too much of  this medicine contact a poison control center or emergency room at once. NOTE: This medicine is only for you. Do not share this medicine with others. What if I miss a dose? If you miss a dose, take it as soon as you can. If it is almost time for your next dose, take only that dose. Do not take double or extra doses. What may interact with this medicine? Do not take this medication with any of the following medicines: -MAOIs like Carbex, Eldepryl, Marplan, Nardil, and Parnate This medicine may also interact with the following medications: -alcohol -antihistamines for allergy, cough and cold -certain medicines for anxiety or sleep -certain medicines for depression like amitriptyline, fluoxetine, sertraline -certain medicines for migraine headache like almotriptan, eletriptan, frovatriptan, naratriptan, rizatriptan, sumatriptan, zolmitriptan -certain medicines for seizures like carbamazepine, oxcarbazepine, phenobarbital, primidone -certain medicines that treat or prevent blood clots like warfarin -digoxin -furazolidone -general anesthetics like halothane, isoflurane, methoxyflurane, propofol -linezolid -local anesthetics like lidocaine, pramoxine, tetracaine -medicines that relax muscles for surgery -other narcotic medicines for pain or cough -phenothiazines like chlorpromazine, mesoridazine, prochlorperazine, thioridazine -procarbazine This list may not describe all possible interactions. Give your health care provider a list of all the medicines, herbs, non-prescription drugs, or dietary supplements you use. Also tell them if you smoke, drink alcohol, or use illegal drugs. Some items may interact with your medicine. What should I watch for while using this medicine? Tell your doctor or health care professional if your pain does not go away, if it gets worse, or if you have new or a different type of pain. You may  develop tolerance to the medicine. Tolerance means that you will need a higher  dose of the medicine for pain relief. Tolerance is normal and is expected if you take this medicine for a long time. Do not suddenly stop taking your medicine because you may develop a severe reaction. Your body becomes used to the medicine. This does NOT mean you are addicted. Addiction is a behavior related to getting and using a drug for a non-medical reason. If you have pain, you have a medical reason to take pain medicine. Your doctor will tell you how much medicine to take. If your doctor wants you to stop the medicine, the dose will be slowly lowered over time to avoid any side effects. There are different types of narcotic medicines (opiates). If you take more than one type at the same time or if you are taking another medicine that also causes drowsiness, you may have more side effects. Give your health care provider a list of all medicines you use. Your doctor will tell you how much medicine to take. Do not take more medicine than directed. Call emergency for help if you have problems breathing or unusual sleepiness. You may get drowsy or dizzy. Do not drive, use machinery, or do anything that needs mental alertness until you know how this medicine affects you. Do not stand or sit up quickly, especially if you are an older patient. This reduces the risk of dizzy or fainting spells. Alcohol can increase or decrease the effects of this medicine. Avoid alcoholic drinks. You may have constipation. Try to have a bowel movement at least every 2 to 3 days. If you do not have a bowel movement for 3 days, call your doctor or health care professional. Your mouth may get dry. Chewing sugarless gum or sucking hard candy, and drinking plenty of water may help. Contact your doctor if the problem does not go away or is severe. What side effects may I notice from receiving this medicine? Side effects that you should report to your doctor or health care professional as soon as possible: -allergic reactions like  skin rash, itching or hives, swelling of the face, lips, or tongue -breathing problems -confusion -seizures -signs and symptoms of low blood pressure like dizziness; feeling faint or lightheaded, falls; unusually weak or tired -trouble passing urine or change in the amount of urine Side effects that usually do not require medical attention (report to your doctor or health care professional if they continue or are bothersome): -constipation -dry mouth -nausea, vomiting -tiredness This list may not describe all possible side effects. Call your doctor for medical advice about side effects. You may report side effects to FDA at 1-800-FDA-1088. Where should I keep my medicine? Keep out of the reach of children. This medicine may cause accidental overdose and death if it taken by other adults, children, or pets. Mix any unused medicine with a substance like cat litter or coffee grounds. Then throw the medicine away in a sealed container like a sealed bag or a coffee can with a lid. Do not use the medicine after the expiration date. Store at room temperature between 15 and 30 degrees C (59 and 86 degrees F). NOTE: This sheet is a summary. It may not cover all possible information. If you have questions about this medicine, talk to your doctor, pharmacist, or health care provider.  2018 Elsevier/Gold Standard (2015-09-06 09:00:04)

## 2018-08-13 ENCOUNTER — Ambulatory Visit: Payer: BLUE CROSS/BLUE SHIELD | Attending: Adult Health

## 2018-08-13 ENCOUNTER — Other Ambulatory Visit: Payer: Self-pay

## 2018-08-13 DIAGNOSIS — M6281 Muscle weakness (generalized): Secondary | ICD-10-CM

## 2018-08-13 DIAGNOSIS — M542 Cervicalgia: Secondary | ICD-10-CM | POA: Diagnosis present

## 2018-08-13 NOTE — Therapy (Addendum)
[place], [place], [address] Phone: [phone]   Fax:  [phone]  Physical Therapy Evaluation  Patient Details  Name: Seth Adams MRN: [id number] Date of Birth: Jun 01, 1956 Referring Provider: Ward Givens , NP   Encounter Date: 08/13/2018  PT End of Session - 08/13/18 1141    Visit Number  1    Number of Visits  10    Date for PT Re-Evaluation  09/14/18    PT Start Time  1104    PT Stop Time  1145    PT Time Calculation (min)  41 min    Activity Tolerance  No increased pain;Patient tolerated treatment well    Behavior During Therapy  [facility] for tasks assessed/performed       No past medical history on file.  Past Surgical History:  Procedure Laterality Date  . BACK SURGERY     Lower back surgery x2  . CATARACT EXTRACTION W/ INTRAOCULAR LENS  IMPLANT, BILATERAL    . eyelid lift  1   2019 [place]  . FOOT SURGERY      There were no vitals filed for this visit.   Subjective Assessment - 08/13/18 1114    Subjective  Chronic neck pain.   He reports injury but does not remember which accident started pain.   Now pain comes quicker and last longer and rainy weather causes pain. Reports stiffness of neck. Had Dry needling and this was beneficial in previous PT sessions.      Pain was not resolved after last episode of PT care.       Pertinent History  Lumbar surgery x 2.      Limitations  Lifting   AM and PM pain.    Diagnostic tests  MRI: Degenerative changes.     Currently in Pain?  Yes    Pain Score  4     Pain Location  Neck    Pain Orientation  Left;Right;Posterior    Pain Descriptors / Indicators  Sharp   stiffness   Pain Type  Chronic pain    Pain Radiating Towards  To traps    Pain Onset  More than a month ago    Pain Frequency  Constant    Aggravating Factors   Lifting, using arms with heavy activity/ cleaning.     Pain Relieving Factors  med,          [facility] PT  Assessment - 08/13/18 0001      Assessment   Medical Diagnosis  cervical spondylosis    Referring Provider  Ward Givens , NP    Onset Date/Surgical Date  --   couple of years ago   Next MD Visit  As needed    Prior Therapy  Yes.  He was some better.       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      Cognition   Overall Cognitive Status  Within Functional Limits for tasks assessed      Observation/Other Assessments   Focus on Therapeutic Outcomes (FOTO)   39% limited  ROM / Strength   AROM / PROM / Strength  AROM;Strength;PROM      AROM   AROM Assessment Site  Cervical    Cervical Flexion  63    Cervical Extension  43   pain worse than flexion   Cervical - Right Side Bend  30    Cervical - Left Side Bend  30    Cervical - Right Rotation  60    Cervical - Left Rotation  43   pain  on left neck     Strength   Overall Strength Comments  UE WNL but with pain LT shoulder  with abduction and flexion in LT shoulder not neck                Objective measurements completed on examination: See above findings.              PT Education - 08/13/18 1139    Education Details  POC , HEP     Person(s) Educated  Patient    Methods  Explanation;Demonstration;Verbal cues;Handout    Comprehension  Returned demonstration;Verbalized understanding       PT Short Term Goals - 08/13/18 1145      PT SHORT TERM GOAL #1   Title  Independent with initial hEP    Time  2    Period  Weeks    Status  New        PT Long Term Goals - 08/13/18 1139      PT LONG TERM GOAL #1   Title  Pt will be IND in HEP to improve pain, strength, and posture. TARGET DATE FOR ALL LTGS: 09/14/18/18    Time  5     Period  Weeks    Status  New      PT LONG TERM GOAL #2   Title  FOTO score improved by  % limited    Time  5    Period  Weeks    Status  New      PT LONG TERM GOAL #3   Title  Pt will perform cervical ROM in all directions (flex,ext, rot, sidebending) With 2-3 mac pain to improve functional mobility.     Time  5    Period  Weeks    Status  New      PT LONG TERM GOAL #4   Title  He will report pain with work improved by 50%    Time  5    Period  Weeks    Status  New      PT LONG TERM GOAL #5   Title  FOTO score decr to 33% limited or less    Time  5    Period  Weeks    Status  New             Plan - 08/13/18 1142    Clinical Impression Statement  Seth Adams with chronic neck pain and stiffness that does not limit fyunction but dauses pain with activity and neck motion. He was seen in PT a year agoe with some benefit but stopped coming at that time.  He felt he is worse than last year so wanted DN and PT again to see if he can reduce pain    Clinical Presentation  Stable    Clinical Decision Making  Low    Rehab Potential  Good    PT Frequency  2x / week    PT Duration  --  5 weeks   PT Treatment/Interventions  Passive range of motion;Manual techniques;Patient/family education;Therapeutic exercise;Therapeutic activities;Moist Heat;Iontophoresis 64m/ml Dexamethasone;Dry needling    PT Next Visit Plan  [facility] HEP.  modalities possibly traction, manual for ROm and pain    PT Home Exercise Plan  NEck rotation and side bend with chin tuck 4 inches form chest    Consulted and Agree with Plan of Care  Patient       Patient will benefit from skilled therapeutic intervention in order to improve the following deficits and impairments:  Pain, Postural dysfunction, Increased muscle spasms, Decreased activity tolerance  Visit Diagnosis: Cervicalgia - Plan: PT plan of care cert/re-cert  Muscle weakness (generalized) - Plan: PT plan of care  cert/re-cert     Problem List There are no active problems to display for this patient.   CDarrel HooverPT 08/13/2018, 12:07 PM  [facility] [address]Phone: [phone]  Fax:  [phone] Name: Seth LAMBAMRN: [id number] of Birth: [birth year]

## 2018-08-13 NOTE — Addendum Note (Signed)
Addended by: Caprice RedHASSE, Shareka Casale M on: 08/13/2018 11:47 AM   Modules accepted: Orders

## 2018-08-13 NOTE — Patient Instructions (Signed)
Issued neck flexion and side bend and rotation with chin 4 fingers from chest 6-8x/da 2-3 reps

## 2018-08-23 ENCOUNTER — Encounter

## 2018-08-28 ENCOUNTER — Encounter: Payer: Self-pay | Admitting: Physical Therapy

## 2018-08-28 ENCOUNTER — Ambulatory Visit: Payer: BLUE CROSS/BLUE SHIELD | Attending: Adult Health | Admitting: Physical Therapy

## 2018-08-28 DIAGNOSIS — M542 Cervicalgia: Secondary | ICD-10-CM | POA: Diagnosis not present

## 2018-08-28 DIAGNOSIS — M6281 Muscle weakness (generalized): Secondary | ICD-10-CM | POA: Diagnosis present

## 2018-08-28 NOTE — Therapy (Signed)
[place], [place], [address] Phone: [phone]   Fax:  [phone]  Physical Therapy Treatment  Patient Details  Name: Seth Adams MRN: [id number] Date of Birth: May 25, 1956 Referring Provider: Ward Givens , NP   Encounter Date: 08/28/2018  PT End of Session - 08/28/18 1319    Visit Number  2    Number of Visits  10    Date for PT Re-Evaluation  09/14/18    PT Start Time  0814    PT Stop Time  4818    PT Time Calculation (min)  30 min    Activity Tolerance  Patient tolerated treatment well    Behavior During Therapy  [facility] for tasks assessed/performed       History reviewed. No pertinent past medical history.  Past Surgical History:  Procedure Laterality Date  . BACK SURGERY     Lower back surgery x2  . CATARACT EXTRACTION W/ INTRAOCULAR LENS  IMPLANT, BILATERAL    . eyelid lift  1   2019 [place]  . FOOT SURGERY      There were no vitals filed for this visit.  Subjective Assessment - 08/28/18 1319    Subjective  Neck is feeling tight today, bil upper traps.     Currently in Pain?  Yes    Pain Score  --   "hurts pretty good"   Pain Location  Shoulder    Pain Descriptors / Indicators  Tightness                       [facility] Adult PT Treatment/Exercise - 08/28/18 0001      Exercises   Exercises  Neck      Neck Exercises: Supine   Neck Retraction  15 reps;5 secs      Manual Therapy   Manual Therapy  Soft tissue mobilization;Joint mobilization    Manual therapy comments  skilled palpation and monitoring during TPDN    Joint Mobilization  bil first rib    Soft tissue mobilization  bilupper trap, suboccipital release       Trigger Point Dry Needling - 08/28/18 1320    Consent Given?  Yes    Education Handout Provided  --   verbal education   Muscles Treated Upper Body  Upper trapezius    Upper Trapezius Response  Twitch reponse elicited;Palpable  increased muscle length   bilat          PT Education - 08/28/18 1348    Education Details  TPDN & expected outcomes, HEP, importance of posture & habits    Person(s) Educated  Patient    Methods  Explanation;Demonstration;Tactile cues;Verbal cues;Handout    Comprehension  Verbalized understanding;Returned demonstration;Verbal cues required;Tactile cues required;Need further instruction       PT Short Term Goals - 08/13/18 1145      PT SHORT TERM GOAL #1   Title  Independent with initial hEP    Time  2    Period  Weeks    Status  New        PT Long Term Goals - 08/13/18 1139      PT LONG TERM GOAL #1   Title  Pt will be IND in HEP to improve pain, strength, and posture. TARGET DATE FOR ALL LTGS: 09/14/18/18    Time  5    Period  Weeks    Status  New      PT LONG TERM  GOAL #2   Title  FOTO score improved by  % limited    Time  5    Period  Weeks    Status  New      PT LONG TERM GOAL #3   Title  Pt will perform cervical ROM in all directions (flex,ext, rot, sidebending) With 2-3 mac pain to improve functional mobility.     Time  5    Period  Weeks    Status  New      PT LONG TERM GOAL #4   Title  He will report pain with work improved by 50%    Time  5    Period  Weeks    Status  New      PT LONG TERM GOAL #5   Title  FOTO score decr to 33% limited or less    Time  5    Period  Weeks    Status  New            Plan - 08/28/18 1349    Clinical Impression Statement  Reported significant decrease in pain following DN today. Asked him to avoid crossing arms-put hands in pockets or behind back- and driving posture to reduce rounding shoulders.     PT Treatment/Interventions  Passive range of motion;Manual techniques;Patient/family education;Therapeutic exercise;Therapeutic activities;Moist Heat;Iontophoresis 48m/ml Dexamethasone;Dry needling    PT Next Visit Plan  TPDN PRN, periscapular strengthening, postural training    PT Home Exercise Plan  NEck  rotation and side bend with chin tuck 4 inches form chest; door pec stretch, scap retraction, row    Consulted and Agree with Plan of Care  Patient       Patient will benefit from skilled therapeutic intervention in order to improve the following deficits and impairments:  Pain, Postural dysfunction, Increased muscle spasms, Decreased activity tolerance  Visit Diagnosis: Cervicalgia  Muscle weakness (generalized)     Problem List There are no active problems to display for this patient.   Khary Schaben C. Marcques Wrightsman PT, DPT 08/28/18 1:51 PM   [facility] [address]Phone: [phone]  Fax:  [phone] Name: Seth MICHELETTIMRN: [id number] of Birth: 310-02-1956

## 2018-09-03 ENCOUNTER — Ambulatory Visit: Payer: BLUE CROSS/BLUE SHIELD

## 2018-09-05 ENCOUNTER — Ambulatory Visit: Payer: BLUE CROSS/BLUE SHIELD | Admitting: Physical Therapy

## 2018-09-10 ENCOUNTER — Ambulatory Visit: Payer: BLUE CROSS/BLUE SHIELD | Admitting: Physical Therapy

## 2018-09-10 ENCOUNTER — Encounter: Payer: Self-pay | Admitting: Physical Therapy

## 2018-09-10 DIAGNOSIS — M542 Cervicalgia: Secondary | ICD-10-CM | POA: Diagnosis not present

## 2018-09-10 DIAGNOSIS — M6281 Muscle weakness (generalized): Secondary | ICD-10-CM

## 2018-09-10 NOTE — Therapy (Signed)
[place], [place], [address] Phone: [phone]   Fax:  [phone]  Physical Therapy Treatment  Patient Details  Name: Seth Adams MRN: [id number] Date of Birth: 1956-04-28 Referring Provider: Ward Givens , NP   Encounter Date: 09/10/2018  PT End of Session - 09/10/18 1244    Visit Number  3    Number of Visits  10    Date for PT Re-Evaluation  09/14/18    PT Start Time  1101    PT Stop Time  1146    PT Time Calculation (min)  45 min    Activity Tolerance  Patient tolerated treatment well    Behavior During Therapy  [facility] for tasks assessed/performed       History reviewed. No pertinent past medical history.  Past Surgical History:  Procedure Laterality Date  . BACK SURGERY     Lower back surgery x2  . CATARACT EXTRACTION W/ INTRAOCULAR LENS  IMPLANT, BILATERAL    . eyelid lift  1   2019 [place]  . FOOT SURGERY      There were no vitals filed for this visit.  Subjective Assessment - 09/10/18 1105    Subjective  "The neck pain, and my head is spliting today"    Currently in Pain?  Yes    Pain Score  7     Pain Orientation  Left;Right    Pain Descriptors / Indicators  Tightness    Pain Type  Chronic pain    Pain Onset  More than a month ago    Pain Frequency  Constant    Aggravating Factors   lifting, using arms with heavy activity/ cleaning    Pain Relieving Factors  med                       [facility] Adult PT Treatment/Exercise - 09/10/18 1118      Neck Exercises: Supine   Neck Retraction  10 reps;5 secs   chin tuck head lift     Lumbar Exercises: Seated   Other Seated Lumbar Exercises  pelvic tilt 1 x 10    demonstration for proper form     Shoulder Exercises: Seated   Other Seated Exercises  lower trap       Manual Therapy   Manual Therapy  Taping    Manual therapy comments  skilled palpation and monitoring during TPDN    Joint Mobilization   bil first rib, T1-T8 PA grade 3    Soft tissue mobilization  bilupper trap, suboccipital release    McConnell  L upper trap inhibition taping      Neck Exercises: Stretches   Upper Trapezius Stretch  2 reps;30 seconds       Trigger Point Dry Needling - 09/10/18 1117    Consent Given?  Yes    Education Handout Provided  No    Upper Trapezius Response  Twitch reponse elicited;Palpable increased muscle length    Oblique Capitus Response  Twitch response elicited;Palpable increased muscle length   b   SubOccipitals Response  Twitch response elicited;Palpable increased muscle length   b          PT Education - 09/10/18 1150    Education Details  inhibition taping benefits and length of wear. updated HEP for lower trap strength and seated posture    Person(s) Educated  Patient    Methods  Explanation;Verbal cues;Handout;Demonstration    Comprehension  Verbalized  understanding;Verbal cues required;Returned demonstration       PT Short Term Goals - 08/13/18 1145      PT SHORT TERM GOAL #1   Title  Independent with initial hEP    Time  2    Period  Weeks    Status  New        PT Long Term Goals - 08/13/18 1139      PT LONG TERM GOAL #1   Title  Pt will be IND in HEP to improve pain, strength, and posture. TARGET DATE FOR ALL LTGS: 09/14/18/18    Time  5    Period  Weeks    Status  New      PT LONG TERM GOAL #2   Title  FOTO score improved by  % limited    Time  5    Period  Weeks    Status  New      PT LONG TERM GOAL #3   Title  Pt will perform cervical ROM in all directions (flex,ext, rot, sidebending) With 2-3 mac pain to improve functional mobility.     Time  5    Period  Weeks    Status  New      PT LONG TERM GOAL #4   Title  He will report pain with work improved by 50%    Time  5    Period  Weeks    Status  New      PT LONG TERM GOAL #5   Title  FOTO score decr to 33% limited or less    Time  5    Period  Weeks    Status  New             Plan - 09/10/18 1244    Clinical Impression Statement  pt reported continued pain prior to treatment. conintued DN today of bil upper trap, obliqous capitous, and sub-occipitals followed with IASTM and mobs. trialed inhibition taping on the L upper trap. He performed strengthening and reported he pain dropped to 2-3/10 end of session.     PT Next Visit Plan  TPDN PRN, periscapular strengthening, postural training    PT Home Exercise Plan  NEck rotation and side bend with chin tuck 4 inches form chest; door pec stretch, scap retraction, row    Consulted and Agree with Plan of Care  Patient       Patient will benefit from skilled therapeutic intervention in order to improve the following deficits and impairments:  Pain, Postural dysfunction, Increased muscle spasms, Decreased activity tolerance  Visit Diagnosis: Cervicalgia  Muscle weakness (generalized)     Problem List There are no active problems to display for this patient.  Starr Lake PT, DPT, LAT, ATC  09/10/18  12:50 PM      [facility] [address] [place], [place], [address] Phone: [phone]   Fax:  [phone]  Name: Seth Adams MRN: [id number] Date of Birth: 1956/12/03

## 2018-09-12 ENCOUNTER — Ambulatory Visit: Payer: BLUE CROSS/BLUE SHIELD | Admitting: Physical Therapy

## 2018-09-17 ENCOUNTER — Ambulatory Visit: Payer: BLUE CROSS/BLUE SHIELD | Admitting: Physical Therapy

## 2018-09-25 ENCOUNTER — Encounter: Payer: Self-pay | Admitting: Physical Therapy

## 2018-09-25 ENCOUNTER — Ambulatory Visit: Payer: BLUE CROSS/BLUE SHIELD | Attending: Adult Health | Admitting: Physical Therapy

## 2018-09-25 DIAGNOSIS — M542 Cervicalgia: Secondary | ICD-10-CM | POA: Diagnosis not present

## 2018-09-25 DIAGNOSIS — M6281 Muscle weakness (generalized): Secondary | ICD-10-CM | POA: Insufficient documentation

## 2018-09-25 NOTE — Therapy (Signed)
[place], [place], [address] Phone: [phone]   Fax:  [phone]  Physical Therapy Treatment / Re-certification  Patient Details  Name: Seth Adams MRN: [id number] Date of Birth: October 06, 1956 Referring Provider (PT): Ward Givens , NP   Encounter Date: 09/25/2018  PT End of Session - 09/25/18 1149    Visit Number  4    Number of Visits  10    Date for PT Re-Evaluation  10/23/18    PT Start Time  1779    PT Stop Time  1228    PT Time Calculation (min)  43 min    Activity Tolerance  Patient tolerated treatment well    Behavior During Therapy  [facility] for tasks assessed/performed       History reviewed. No pertinent past medical history.  Past Surgical History:  Procedure Laterality Date  . BACK SURGERY     Lower back surgery x2  . CATARACT EXTRACTION W/ INTRAOCULAR LENS  IMPLANT, BILATERAL    . eyelid lift  1   2019 [place]  . FOOT SURGERY      There were no vitals filed for this visit.  Subjective Assessment - 09/25/18 1151    Subjective  "I am doing better, still some soreness"     Currently in Pain?  Yes    Pain Score  4     Pain Orientation  Right;Left    Pain Descriptors / Indicators  Tightness    Pain Type  Chronic pain    Pain Onset  More than a month ago    Pain Frequency  Constant    Aggravating Factors   lifing,using the arm with     Pain Relieving Factors  med         [facility] PT Assessment - 09/25/18 1152      Assessment   Medical Diagnosis  cervical spondylosis    Referring Provider (PT)  Ward Givens , NP      AROM   Cervical Flexion  48    Cervical Extension  20    Cervical - Right Side Bend  28    Cervical - Left Side Bend  30    Cervical - Right Rotation  55    Cervical - Left Rotation  52                   [facility] Adult PT Treatment/Exercise - 09/25/18 1205      Exercises   Exercises  Neck      Neck Exercises: Seated   Other  Seated Exercise  upper cervical rotation 2 x 10    demonstration for proper form     Neck Exercises: Supine   Neck Retraction  10 reps;5 secs      Manual Therapy   Manual Therapy  Passive ROM;Other (comment)    Manual therapy comments  skilled palpation and monitoring during TPDN    Joint Mobilization  bil first rib, T1-T8 PA grade 3    Soft tissue mobilization  bilupper trap, suboccipital release    Passive ROM  cervical traction combined with rotation bil working into end ranges     Other Manual Therapy  MTPR along the scalenes on the L    McConnell  L upper trap inhibition taping      Neck Exercises: Stretches   Upper Trapezius Stretch  2 reps;30 seconds       Trigger Point Dry Needling - 09/25/18 1204  Consent Given?  Yes    Education Handout Provided  No    Upper Trapezius Response  Twitch reponse elicited;Palpable increased muscle length    Oblique Capitus Response  Twitch response elicited;Palpable increased muscle length    SubOccipitals Response  Twitch response elicited;Palpable increased muscle length           PT Education - 09/25/18 1255    Education Details  upper cervical rotation and benefits in regard to opening facet joints and promote movement    Person(s) Educated  Patient    Methods  Explanation;Demonstration    Comprehension  Verbalized understanding;Verbal cues required;Returned demonstration       PT Short Term Goals - 09/25/18 1258      PT SHORT TERM GOAL #1   Title  Independent with initial hEP    Time  2    Period  Weeks    Status  Achieved        PT Long Term Goals - 09/25/18 1258      PT LONG TERM GOAL #1   Title  Pt will be IND in HEP to improve pain, strength, and posture.     Time  5    Period  Weeks    Status  On-going    Target Date  10/23/18      PT LONG TERM GOAL #2   Title  FOTO score improved by   33% limited    Time  5    Period  Weeks    Status  On-going    Target Date  10/23/18      PT LONG TERM GOAL #3    Title  Pt will perform cervical ROM in all directions (flex,ext, rot, sidebending) With 2-3 mac pain to improve functional mobility.     Time  5    Period  Weeks    Status  On-going    Target Date  10/23/18      PT LONG TERM GOAL #4   Title  He will report pain with work improved by 50%    Time  5    Period  Weeks    Target Date  10/23/18            Plan - 09/25/18 1252    Clinical Impression Statement  Mr Daughdrill reports improvement in pain since the last session but demonstrates decreased cervical ROM compared to previous measures. he is making progress with goals. continued TPDN for bil upper trap/ cervical parapsinals and L levator scapulae followed with IASTM techniaues and mobs. worked on cervical traction with PROM rotation which he reported significant improvement in motion and pain. he would benefit from continued physical therapy to decrease pain and improve cervical mobility.     PT Frequency  2x / week    PT Duration  4 weeks    PT Treatment/Interventions  Passive range of motion;Manual techniques;Patient/family education;Therapeutic exercise;Therapeutic activities;Moist Heat;Iontophoresis 79m/ml Dexamethasone;Dry needling    PT Next Visit Plan  TPDN PRN, periscapular strengthening, postural training    PT Home Exercise Plan  NEck rotation and side bend with chin tuck 4 inches form chest; door pec stretch, scap retraction, row    Consulted and Agree with Plan of Care  Patient       Patient will benefit from skilled therapeutic intervention in order to improve the following deficits and impairments:  Pain, Postural dysfunction, Increased muscle spasms, Decreased activity tolerance  Visit Diagnosis: Cervicalgia  Muscle weakness (generalized)     Problem  List There are no active problems to display for this patient.  Starr Lake PT, DPT, LAT, ATC  09/25/18  1:00 PM      [facility] [address] [place], [place], [address] Phone: [phone]   Fax:  [phone]  Name: WOLFE CAMARENA MRN: [id number] Date of Birth: March 20, 1956

## 2018-10-01 ENCOUNTER — Ambulatory Visit: Payer: BLUE CROSS/BLUE SHIELD | Admitting: Physical Therapy

## 2018-10-01 ENCOUNTER — Encounter: Payer: Self-pay | Admitting: Physical Therapy

## 2018-10-01 DIAGNOSIS — M6281 Muscle weakness (generalized): Secondary | ICD-10-CM

## 2018-10-01 DIAGNOSIS — M542 Cervicalgia: Secondary | ICD-10-CM | POA: Diagnosis not present

## 2018-10-01 NOTE — Therapy (Signed)
[place], [place], [address] Phone: [phone]   Fax:  [phone]  Physical Therapy Treatment  Patient Details  Name: Seth Adams MRN: [id number] Date of Birth: 1956/06/02 Referring Provider (PT): Ward Givens , NP   Encounter Date: 10/01/2018  PT End of Session - 10/01/18 1105    Visit Number  5    Number of Visits  10    Date for PT Re-Evaluation  10/23/18    PT Start Time  9373    PT Stop Time  1142    PT Time Calculation (min)  39 min    Activity Tolerance  Patient tolerated treatment well    Behavior During Therapy  [facility] for tasks assessed/performed       History reviewed. No pertinent past medical history.  Past Surgical History:  Procedure Laterality Date  . BACK SURGERY     Lower back surgery x2  . CATARACT EXTRACTION W/ INTRAOCULAR LENS  IMPLANT, BILATERAL    . eyelid lift  1   2019 [place]  . FOOT SURGERY      There were no vitals filed for this visit.  Subjective Assessment - 10/01/18 1105    Subjective  "After the last the last session it was the best I felt for the longest time and today I am only alittle tight"    Currently in Pain?  Yes    Pain Score  --   unable to tell                      [facility] Adult PT Treatment/Exercise - 10/01/18 1113      Exercises   Exercises  Shoulder;Neck      Neck Exercises: Seated   Other Seated Exercise  SNAGS 2 x 10 bil    given as HEP     Shoulder Exercises: Stretch   Other Shoulder Stretches  rhomboid stretch 2 x 30 sec      Manual Therapy   Manual therapy comments  MTPR along upper trap/ levator scapulae    Joint Mobilization  bil first rib, T1-T8 PA grade 3, C3-C6 L lateral gapping     Soft tissue mobilization  bilupper trap, suboccipital release             PT Education - 10/01/18 1142    Education Details  updated HEP for SNAGs and sub-occipital release    Person(s) Educated  Patient     Methods  Explanation;Verbal cues;Handout    Comprehension  Verbalized understanding;Verbal cues required       PT Short Term Goals - 09/25/18 1258      PT SHORT TERM GOAL #1   Title  Independent with initial hEP    Time  2    Period  Weeks    Status  Achieved        PT Long Term Goals - 09/25/18 1258      PT LONG TERM GOAL #1   Title  Pt will be IND in HEP to improve pain, strength, and posture.     Time  5    Period  Weeks    Status  On-going    Target Date  10/23/18      PT LONG TERM GOAL #2   Title  FOTO score improved by   33% limited    Time  5    Period  Weeks    Status  On-going  Target Date  10/23/18      PT LONG TERM GOAL #3   Title  Pt will perform cervical ROM in all directions (flex,ext, rot, sidebending) With 2-3 mac pain to improve functional mobility.     Time  5    Period  Weeks    Status  On-going    Target Date  10/23/18      PT LONG TERM GOAL #4   Title  He will report pain with work improved by 50%    Time  5    Period  Weeks    Target Date  10/23/18            Plan - 10/01/18 1142    Clinical Impression Statement  pt reported significant relief since the last session. continued manual techniques to promote cerivcal mobility. held off on the DN and utilized MTPR and sub-occipital release techniques and updated for HEP, as well as updated HEP for SNAGs which he reported significant relief of pain and stiffness.     PT Next Visit Plan  TPDN PRN, periscapular strengthening, postural training    PT Home Exercise Plan  NEck rotation and side bend with chin tuck 4 inches form chest; door pec stretch, scap retraction, row    Consulted and Agree with Plan of Care  Patient       Patient will benefit from skilled therapeutic intervention in order to improve the following deficits and impairments:  Pain, Postural dysfunction, Increased muscle spasms, Decreased activity tolerance  Visit Diagnosis: Cervicalgia  Muscle weakness  (generalized)     Problem List There are no active problems to display for this patient.  Starr Lake PT, DPT, LAT, ATC  10/01/18  11:47 AM      [facility] [address] [place], [place], [address] Phone: [phone]   Fax:  [phone]  Name: Seth Adams MRN: [id number] Date of Birth: 05/03/1956

## 2018-10-09 ENCOUNTER — Ambulatory Visit: Payer: BLUE CROSS/BLUE SHIELD | Admitting: Physical Therapy

## 2018-10-09 ENCOUNTER — Encounter: Payer: Self-pay | Admitting: Physical Therapy

## 2018-10-09 DIAGNOSIS — M542 Cervicalgia: Secondary | ICD-10-CM | POA: Diagnosis not present

## 2018-10-09 DIAGNOSIS — M6281 Muscle weakness (generalized): Secondary | ICD-10-CM

## 2018-10-09 NOTE — Therapy (Signed)
[place], [place], [address] Phone: [phone]   Fax:  [phone]  Physical Therapy Treatment  Patient Details  Name: Seth Adams MRN: [id number] Date of Birth: 1956-04-19 Referring Provider (PT): Ward Givens , NP   Encounter Date: 10/09/2018  PT End of Session - 10/09/18 1516    Visit Number  6    Number of Visits  10    Date for PT Re-Evaluation  10/23/18    PT Start Time  8280    PT Stop Time  1501    PT Time Calculation (min)  43 min    Activity Tolerance  Patient tolerated treatment well    Behavior During Therapy  [facility] for tasks assessed/performed       History reviewed. No pertinent past medical history.  Past Surgical History:  Procedure Laterality Date  . BACK SURGERY     Lower back surgery x2  . CATARACT EXTRACTION W/ INTRAOCULAR LENS  IMPLANT, BILATERAL    . eyelid lift  1   2019 [place]  . FOOT SURGERY      There were no vitals filed for this visit.  Subjective Assessment - 10/09/18 1424    Subjective  " The neck has been doing half way decent, still some stress and some increased pain R low back today"     Currently in Pain?  Yes    Pain Score  1          [facility] PT Assessment - 10/09/18 0001      AROM   Cervical - Right Rotation  67    Cervical - Left Rotation  67                   [facility] Adult PT Treatment/Exercise - 10/09/18 1437      Exercises   Exercises  Shoulder;Neck      Neck Exercises: Supine   Neck Retraction  10 reps;5 secs   chin tuck head lift     Lumbar Exercises: Stretches   Other Lumbar Stretch Exercise  quadratus lumborum stretch 2 x 30       Manual Therapy   Manual therapy comments  skilled palpation and monitoring during TPDN    Joint Mobilization  T1-T8 PA grade 3, C3-C6 PA     Soft tissue mobilization  IASTM       Neck Exercises: Stretches   Upper Trapezius Stretch  2 reps;30 seconds    Levator Stretch  2  reps;30 seconds       Trigger Point Dry Needling - 10/09/18 1435    Consent Given?  Yes    Education Handout Provided  No   given previously   Upper Trapezius Response  Twitch reponse elicited;Palpable increased muscle length    Oblique Capitus Response  Twitch response elicited;Palpable increased muscle length    SubOccipitals Response  Twitch response elicited;Palpable increased muscle length           PT Education - 10/09/18 1515    Education Details  reviewed previously provided HEP, and discussed progress regarding ROM compared to previous measures    Person(s) Educated  Patient    Methods  Explanation;Verbal cues    Comprehension  Verbalized understanding;Verbal cues required       PT Short Term Goals - 09/25/18 1258      PT SHORT TERM GOAL #1   Title  Independent with initial hEP    Time  2  Period  Weeks    Status  Achieved        PT Long Term Goals - 09/25/18 1258      PT LONG TERM GOAL #1   Title  Pt will be IND in HEP to improve pain, strength, and posture.     Time  5    Period  Weeks    Status  On-going    Target Date  10/23/18      PT LONG TERM GOAL #2   Title  FOTO score improved by   33% limited    Time  5    Period  Weeks    Status  On-going    Target Date  10/23/18      PT LONG TERM GOAL #3   Title  Pt will perform cervical ROM in all directions (flex,ext, rot, sidebending) With 2-3 mac pain to improve functional mobility.     Time  5    Period  Weeks    Status  On-going    Target Date  10/23/18      PT LONG TERM GOAL #4   Title  He will report pain with work improved by 50%    Time  5    Period  Weeks    Target Date  10/23/18            Plan - 10/09/18 1516    Clinical Impression Statement  continued working on DN along the cerivcal paraspinals and levator scapulae followed with IASTM and cervicothoracic mobs. he was able to work on cerivcal stability exercises and strethcing which he continues to reported decreased pain  and improved cervical mobility compared to previous measures.     PT Treatment/Interventions  Passive range of motion;Manual techniques;Patient/family education;Therapeutic exercise;Therapeutic activities;Moist Heat;Iontophoresis 58m/ml Dexamethasone;Dry needling    PT Next Visit Plan  TPDN PRN, periscapular strengthening, postural training    PT Home Exercise Plan  NEck rotation and side bend with chin tuck 4 inches form chest; door pec stretch, scap retraction, row    Consulted and Agree with Plan of Care  Patient       Patient will benefit from skilled therapeutic intervention in order to improve the following deficits and impairments:  Pain, Postural dysfunction, Increased muscle spasms, Decreased activity tolerance  Visit Diagnosis: Cervicalgia  Muscle weakness (generalized)     Problem List There are no active problems to display for this patient.  KStarr LakePT, DPT, LAT, ATC  10/09/18  3:20 PM      [facility] [address]Phone: [phone]  Fax:  [phone] Name: Seth CALLICOTTMRN: [id number] of Birth: [birth year]

## 2018-10-15 ENCOUNTER — Encounter: Payer: Self-pay | Admitting: Physical Therapy

## 2018-10-15 ENCOUNTER — Ambulatory Visit: Payer: BLUE CROSS/BLUE SHIELD | Admitting: Physical Therapy

## 2018-10-15 DIAGNOSIS — M542 Cervicalgia: Secondary | ICD-10-CM

## 2018-10-15 DIAGNOSIS — M6281 Muscle weakness (generalized): Secondary | ICD-10-CM

## 2018-10-15 NOTE — Therapy (Addendum)
[place], [place], [address] Phone: [phone]   Fax:  [phone]  Physical Therapy Treatment / Discharge Note  Patient Details  Name: Seth Adams MRN: [id number] Date of Birth: [birth year] Referring Provider (PT): Ward Givens , NP   Encounter Date: 10/15/2018  PT End of Session - 10/15/18 1330    Visit Number  7    Number of Visits  10    Date for PT Re-Evaluation  10/23/18    PT Start Time  1330    PT Stop Time  1413    PT Time Calculation (min)  43 min    Activity Tolerance  Patient tolerated treatment well    Behavior During Therapy  [facility] for tasks assessed/performed       History reviewed. No pertinent past medical history.  Past Surgical History:  Procedure Laterality Date  . BACK SURGERY     Lower back surgery x2  . CATARACT EXTRACTION W/ INTRAOCULAR LENS  IMPLANT, BILATERAL    . eyelid lift  1   2019 [place]  . FOOT SURGERY      There were no vitals filed for this visit.  Subjective Assessment - 10/15/18 1331    Subjective  "the neck is doing okay, doing alot of lifting"    Currently in Pain?  Yes    Pain Score  3     Pain Location  Shoulder    Pain Orientation  Right;Left    Pain Descriptors / Indicators  Tightness    Pain Type  Chronic pain    Pain Onset  More than a month ago    Pain Frequency  Intermittent    Aggravating Factors   lifting     Pain Relieving Factors  meds.                        [facility] Adult PT Treatment/Exercise - 10/15/18 1339      Self-Care   Self-Care  Posture    Posture  seated posture keep a slight curve in the low back to reduce pain and promote proper positioning   reports improvement of pain in the neck     Exercises   Exercises  Lumbar      Lumbar Exercises: Seated   Other Seated Lumbar Exercises  seated pelvic tilt 2 x 10    seated on dynadisc     Shoulder Exercises: Stretch   Other Shoulder Stretches   upper trap PNF contract/ relax with 10 sec contractoin      Modalities   Modalities  Traction      Traction   Type of Traction  Cervical    Min (lbs)  8    Max (lbs)  12    Hold Time  60    Rest Time  30    Time  10      Manual Therapy   Manual Therapy  Manual Traction    Manual therapy comments  sub-occipital release and MTPR along upper trap and levator scapulae    Joint Mobilization  frist rib on the L grad3 with pt breathing in/out for MWM.     Passive ROM  cervical traction combined with rotation bil working into end ranges       Neck Exercises: Stretches   Upper Trapezius Stretch  2 reps;30 seconds    Levator Stretch  2 reps;30 seconds  PT Education - 10/15/18 1408    Education Details  reviewed posture and benefits of cervical traction to open facet joints.     Person(s) Educated  Patient    Methods  Explanation;Verbal cues    Comprehension  Verbalized understanding;Verbal cues required       PT Short Term Goals - 09/25/18 1258      PT SHORT TERM GOAL #1   Title  Independent with initial hEP    Time  2    Period  Weeks    Status  Achieved        PT Long Term Goals - 09/25/18 1258      PT LONG TERM GOAL #1   Title  Pt will be IND in HEP to improve pain, strength, and posture.     Time  5    Period  Weeks    Status  On-going    Target Date  10/23/18      PT LONG TERM GOAL #2   Title  FOTO score improved by   33% limited    Time  5    Period  Weeks    Status  On-going    Target Date  10/23/18      PT LONG TERM GOAL #3   Title  Pt will perform cervical ROM in all directions (flex,ext, rot, sidebending) With 2-3 mac pain to improve functional mobility.     Time  5    Period  Weeks    Status  On-going    Target Date  10/23/18      PT LONG TERM GOAL #4   Title  He will report pain with work improved by 50%    Time  5    Period  Weeks    Target Date  10/23/18            Plan - 10/15/18 1409    Clinical Impression  Statement  pt noted increased soreness likely to doningmore activities over the weekend with lifting and carrying. continued manual techniques to promote cervical and 1st rib mobility, as well trialed mechanical traction which he noted improvement of pain.     PT Treatment/Interventions  Passive range of motion;Manual techniques;Patient/family education;Therapeutic exercise;Therapeutic activities;Moist Heat;Iontophoresis 56m/ml Dexamethasone;Dry needling    PT Next Visit Plan  TPDN PRN, periscapular strengthening, postural training, how was traction, lifting mechanics    PT Home Exercise Plan  NEck rotation and side bend with chin tuck 4 inches form chest; door pec stretch, scap retraction, row    Consulted and Agree with Plan of Care  Patient       Patient will benefit from skilled therapeutic intervention in order to improve the following deficits and impairments:  Pain, Postural dysfunction, Increased muscle spasms, Decreased activity tolerance  Visit Diagnosis: Cervicalgia  Muscle weakness (generalized)     Problem List There are no active problems to display for this patient.  KStarr LakePT, DPT, LAT, ATC  10/15/18  2:11 PM      [facility]GRiverdale [place] [address]Phone: [phone]  Fax:  [phone] Name: Seth HUSTEDMRN: [id number] of Birth: 329-Dec-1957      PHYSICAL THERAPY DISCHARGE SUMMARY  Visits from Start of Care: 7  Current functional level related to goals / functional outcomes: See goals   Remaining deficits: unknown   Education / Equipment: HEP, theraband, posture  Plan: Patient agrees to discharge.  Patient goals were  partially met. Patient is being discharged due to not returning since the last visit.  ?????         Syleena Mchan PT, DPT, LAT, ATC  11/08/18  2:10 PM

## 2019-02-07 ENCOUNTER — Ambulatory Visit (INDEPENDENT_AMBULATORY_CARE_PROVIDER_SITE_OTHER): Payer: BLUE CROSS/BLUE SHIELD | Admitting: Neurology

## 2019-02-07 ENCOUNTER — Encounter: Payer: Self-pay | Admitting: Neurology

## 2019-02-07 DIAGNOSIS — M47812 Spondylosis without myelopathy or radiculopathy, cervical region: Secondary | ICD-10-CM | POA: Diagnosis not present

## 2019-02-07 HISTORY — DX: Spondylosis without myelopathy or radiculopathy, cervical region: M47.812

## 2019-02-07 MED ORDER — CYCLOBENZAPRINE HCL 10 MG PO TABS: 10.0000 mg | ORAL_TABLET | Freq: Three times a day (TID) | ORAL | 3 refills | Status: DC | PRN

## 2019-02-07 MED ORDER — PREDNISONE 5 MG PO TABS: ORAL_TABLET | ORAL | 0 refills | Status: DC

## 2019-02-07 NOTE — Progress Notes (Signed)
Reason for visit: Cervical spondylosis  Seth Adams is an 63 y.o. male  History of present illness:  Seth Adams is a 63 year old right-handed white male with a history of cervical spondylosis.  The patient is had some neck discomfort and occasional paresthesias down the arms.  The patient underwent physical therapy over 2 months ago and had dry needling which resulted in significant improvement in his symptoms.  He has just recently started noticing a return of some of the discomfort.  He tries to stretch out on a regular basis.  Nonsteroidal anti-inflammatory medications such as naproxen resulted in stomach upset if he takes it for more than 2 days at a time.  He occasionally will take prednisone if needed for the gout.  The patient overall is very pleased with how he has done.  He will take Flexeril on occasion.  He could not tolerate even very low-dose gabapentin.  He returns to this office for an evaluation.  Past Medical History:  Diagnosis Date  . Cervical spondylosis 02/07/2019    Past Surgical History:  Procedure Laterality Date  . BACK SURGERY     Lower back surgery x2  . CATARACT EXTRACTION W/ INTRAOCULAR LENS  IMPLANT, BILATERAL    . eyelid lift  1   2019 [facility]  . FOOT SURGERY      History reviewed. No pertinent family history.  Social history:  reports that he has never smoked. He has never used smokeless tobacco. He reports current alcohol use. No history on file for drug.    Allergies  Allergen Reactions  . Codeine Itching and Nausea Only    Medications:  Prior to Admission medications   Medication Sig Start Date End Date Taking? Authorizing Provider  allopurinol (ZYLOPRIM) 300 MG tablet Take 300 mg by mouth as needed.   Yes [provider]  CALCIUM PO Take 1 tablet by mouth daily.   Yes [provider]  Cholecalciferol (VITAMIN D PO) Take 1 tablet by mouth daily.   Yes [provider]  CINNAMON PO Take 1 tablet by  mouth 2 (two) times daily.   Yes [provider]  colchicine 0.6 MG tablet Take 0.6 mg by mouth as needed.    Yes [provider]  cyclobenzaprine (FLEXERIL) 10 MG tablet Take 1 tablet (10 mg total) by mouth 3 (three) times daily. 06/26/17  Yes York Spaniel, MD  fluticasone [facility]) 50 MCG/ACT nasal spray Place 2 sprays into both nostrils daily.   Yes [provider]  GARLIC PO Take 1 tablet by mouth 2 (two) times daily.   Yes [provider]  ibuprofen (ADVIL,MOTRIN) 100 MG tablet Take 100 mg by mouth every 6 (six) hours as needed for fever.   Yes [provider]  Multiple Vitamin (MULTIVITAMIN WITH MINERALS) TABS tablet Take 1 tablet by mouth daily.   Yes [provider]  OVER THE COUNTER MEDICATION Take 5 drops by mouth daily. Mixes Frankincense and Thieves in liquid to drink daily   Yes [provider]  traMADol (ULTRAM) 50 MG tablet Take 0.5 tablets (25 mg total) by mouth every 6 (six) hours as needed for moderate pain. 08/01/18  Yes Butch Penny, NP    ROS:  Out of a complete 14 system review of symptoms, the patient complains only of the following symptoms, and all other reviewed systems are negative.  Joint pain, joint swelling, neck stiffness  Blood pressure (!) 160/92, pulse 79, height 5\' 8"  (1.727  m), weight 185 lb (83.9 kg).  Physical Exam  General: The patient is alert and cooperative at the time of the examination.  The patient is moderately obese.  Neuromuscular: Range of movement of the cervical spine is limited, lacking about 40 degrees of full lateral rotation bilaterally.  Skin: No significant peripheral edema is noted.   Neurologic Exam  Mental status: The patient is alert and oriented x 3 at the time of the examination. The patient has apparent normal recent and remote memory, with an apparently normal attention span and concentration ability.   Cranial nerves: Facial symmetry is present. Speech  is normal, no aphasia or dysarthria is noted. Extraocular movements are full. Visual fields are full.  Motor: The patient has good strength in all 4 extremities.  Sensory examination: Soft touch sensation is symmetric on the face, arms, and legs.  Coordination: The patient has good finger-nose-finger and heel-to-shin bilaterally.  Gait and station: The patient has a normal gait. Tandem gait is normal. Romberg is negative. No drift is seen.  Reflexes: Deep tendon reflexes are symmetric.   Assessment/Plan:  1.  Cervical spondylosis  The patient has done better following dry needling through physical therapy.  This can be repeated in the future if needed.  The patient will be given a prescription for his Flexeril, he requests a small prescription for prednisone in case of a gout attack, this was sent in.  The patient will follow-up through this office in 1 year, sooner if needed.   Marlan Palau. Keith Aileena Iglesia MD 02/07/2019 1:02 PM  [facility] [address] [place], [place] [id number]  Phone [phone] Fax [phone]

## 2019-06-06 ENCOUNTER — Telehealth: Payer: Self-pay | Admitting: Neurology

## 2019-06-06 DIAGNOSIS — M542 Cervicalgia: Secondary | ICD-10-CM

## 2019-06-06 NOTE — Telephone Encounter (Signed)
I called the patient.  The patient has had physical therapy at the [facility] on [facility], I will set him up for physical therapy evaluation for neuromuscular therapy on the neck and for dry needling.  He got a lot of benefit previously.

## 2019-06-06 NOTE — Telephone Encounter (Signed)
Pt is wanting to to go back to therapy for his dry needles. States his neck is in really bad shape. Please advise.

## 2019-09-24 ENCOUNTER — Telehealth: Payer: Self-pay | Admitting: Neurology

## 2019-09-24 DIAGNOSIS — M542 Cervicalgia: Secondary | ICD-10-CM

## 2019-09-24 NOTE — Telephone Encounter (Signed)
I called the patient.  The patient got physical therapy in December 2019 done at the [facility].  I will try to get this set up again.

## 2019-09-24 NOTE — Telephone Encounter (Signed)
Pt is calling in wanting to know if he can get started back on his dry needle shots again

## 2019-10-07 ENCOUNTER — Encounter: Payer: Self-pay | Admitting: Physical Therapy

## 2019-10-07 ENCOUNTER — Ambulatory Visit: Payer: Medicare Other | Attending: Neurology | Admitting: Physical Therapy

## 2019-10-07 ENCOUNTER — Other Ambulatory Visit: Payer: Self-pay

## 2019-10-07 DIAGNOSIS — M542 Cervicalgia: Secondary | ICD-10-CM | POA: Diagnosis present

## 2019-10-07 DIAGNOSIS — M6281 Muscle weakness (generalized): Secondary | ICD-10-CM | POA: Diagnosis present

## 2019-10-07 NOTE — Therapy (Signed)
[facility] [address] [place], [place], [address] Phone: [phone]   Fax:  [phone]  Physical Therapy Evaluation  Patient Details  Name: Seth Adams MRN: [id number] Date of Birth: Sep 01, 1956 Referring Provider (PT): York Spaniel, MD    Encounter Date: 10/07/2019  PT End of Session - 10/07/19 1147    Visit Number  1    Number of Visits  13    Date for PT Re-Evaluation  12/02/19    Authorization Type  self pay    PT Start Time  1147    PT Stop Time  1225    PT Time Calculation (min)  38 min    Activity Tolerance  Patient tolerated treatment well    Behavior During Therapy  [facility] for tasks assessed/performed       Past Medical History:  Diagnosis Date  . Cervical spondylosis 02/07/2019    Past Surgical History:  Procedure Laterality Date  . BACK SURGERY     Lower back surgery x2  . CATARACT EXTRACTION W/ INTRAOCULAR LENS  IMPLANT, BILATERAL    . eyelid lift  1   2019 [facility]  . FOOT SURGERY      There were no vitals filed for this visit.   Subjective Assessment - 10/07/19 1149    Subjective  pt is 63 y.o M with CC of acute on chronic neck pain. The pain started about 3-4 weeks ago with no specific on set. He reports has been trying to do muscle rub to calm down the soreness. Pain stays mostly in the L side.    How long can you sit comfortably?  unlimited    How long can you stand comfortably?  unlimited    How long can you walk comfortably?  unlimited    Patient Stated Goals  to decrase pain and tightness    Currently in Pain?  Yes    Pain Score  5     Pain Orientation  Left;Right    Pain Descriptors / Indicators  Aching;Sore;Tightness    Pain Type  Chronic pain    Pain Onset  More than a month ago    Pain Frequency  Intermittent    Aggravating Factors   in the afternoon    Pain Relieving Factors  muscle rub,         [facility] PT Assessment - 10/07/19 1155      Assessment   Medical  Diagnosis  Cervicalgia    Referring Provider (PT)  York Spaniel, MD     Onset Date/Surgical Date  --   3-4 weeks   Hand Dominance  Right    Prior Therapy  yes      Precautions   Precautions  None      Restrictions   Weight Bearing Restrictions  No      Balance Screen   Has the patient fallen in the past 6 months  Yes    How many times?  1    Has the patient had a decrease in activity level because of a fear of falling?   No    Is the patient reluctant to leave their home because of a fear of falling?   No      Home Public house manager residence      Prior Function   Level of Independence  Independent    Vocation  Self employed      Cognition   Overall Cognitive  Status  Within Functional Limits for tasks assessed      Observation/Other Assessments   Focus on Therapeutic Outcomes (FOTO)   46% limited   predicted 36% limited     ROM / Strength   AROM / PROM / Strength  AROM;Strength      AROM   AROM Assessment Site  Cervical    Cervical Flexion  23    Cervical Extension  60    Cervical - Right Side Bend  20    Cervical - Left Side Bend  32    Cervical - Right Rotation  52    Cervical - Left Rotation  48      Strength   Overall Strength  Within functional limits for tasks performed    Strength Assessment Site  Shoulder    Right/Left Shoulder  Right;Left      Palpation   Palpation comment  TTP along bil L>R upper trap/ levator scapulae with multiple trigger points      Special Tests    Special Tests  Cervical                Objective measurements completed on examination: See above findings.      [facility] Adult PT Treatment/Exercise - 10/07/19 0001      Exercises   Exercises  Neck      Manual Therapy   Manual Therapy  Other (comment);Soft tissue mobilization;Joint mobilization    Manual therapy comments  Skilled palpation and monitoring of pt throughout TPDN    Joint Mobilization  grade III PA T1-T7    Soft tissue  mobilization  IASTM along bil upper trap/ levator scapuale      Neck Exercises: Stretches   Upper Trapezius Stretch  2 reps;30 seconds;Right;Left       Trigger Point Dry Needling - 10/07/19 0001    Consent Given?  Yes    Education Handout Provided  Yes    Muscles Treated Head and Neck  Upper trapezius;Levator scapulae;Semispinalis capitus    Upper Trapezius Response  Twitch reponse elicited;Palpable increased muscle length   bil   Levator Scapulae Response  Twitch response elicited;Palpable increased muscle length   bil   Semispinalis capitus Response  Twitch reponse elicited;Palpable increased muscle length   bil          PT Education - 10/07/19 1242    Education Details  evaluation findings, POC, reviewed HEP (given on previous sessions), reviewed TPDN, goals    Person(s) Educated  Patient    Methods  Explanation;Verbal cues    Comprehension  Verbalized understanding;Verbal cues required       PT Short Term Goals - 10/07/19 1237      PT SHORT TERM GOAL #1   Title  Independent with initial hEP    Time  3    Period  Weeks    Status  New    Target Date  10/28/19        PT Long Term Goals - 10/07/19 1237      PT LONG TERM GOAL #1   Title  increase cervical R Sidebending/ flexion  to [facility] with </= 2/10 pain for functional ROM required for ADLs    Time  6    Period  Weeks    Status  New    Target Date  11/18/19      PT LONG TERM GOAL #2   Title  reduce cervical muscle spasm to redcue pain to </= 1/10 for QOL    Time  6    Period  Weeks    Status  New    Target Date  11/18/19      PT LONG TERM GOAL #3   Title  increase FOTO score to </= 36% limited to demo improvement in function    Time  6    Period  Weeks    Status  New    Target Date  11/18/19      PT LONG TERM GOAL #4   Title  pt to be I with all HEP given as of last visit to maintain and progress current level of function    Time  6    Period  Weeks    Status  New    Target Date  11/18/19              Plan - 10/07/19 1231    Clinical Impression Statement  pt presents to OPPT with CC of acute on chronic neck pain/ muscle spasm beginning 2-3 weeks ago. He demonstrates functional cervical ROM with limited R sidebending with L side neck tightness noted. TTP along bil upper trap/ levator scapulae L>R. reviewed TPDN and consent was provided for tx over bil upper trap/ levator scapuale, and semi-spinalis which he noted relief of pain and stiffness. He would benefit from physical therapy to decrease cervical muscle tension, improve cervical mobility and return to PLOF by addressing the deficits listed.    Personal Factors and Comorbidities  Age;Comorbidity 1    Comorbidities  hx of covid related illness    Stability/Clinical Decision Making  Evolving/Moderate complexity    Clinical Decision Making  Moderate    PT Frequency  2x / week    PT Duration  6 weeks    PT Treatment/Interventions  ADLs/Self Care Home Management;Electrical Stimulation;Iontophoresis 4mg /ml Dexamethasone;Moist Heat;Traction;Ultrasound;Therapeutic activities;Therapeutic exercise;Balance training;Patient/family education;Manual techniques;Dry needling;Taping;Spinal Manipulations    PT Next Visit Plan  review/ update HEP PRN, cervicothoracic mobs, shoulder strengthening, cervical stability, posture/ lifting mechanics, response to TPDN    PT Home Exercise Plan  previously provided HEP    Consulted and Agree with Plan of Care  Patient       Patient will benefit from skilled therapeutic intervention in order to improve the following deficits and impairments:  Pain, Postural dysfunction, Improper body mechanics, Increased muscle spasms, Decreased range of motion, Decreased activity tolerance, Decreased endurance  Visit Diagnosis: Cervicalgia  Muscle weakness (generalized)     Problem List Patient Active Problem List   Diagnosis Date Noted  . Cervical spondylosis 02/07/2019    Starr Lake PT, DPT, LAT,  ATC  10/07/19  12:44 PM      [facility] [address] [place], [place], [address] Phone: [phone]   Fax:  [phone]  Name: BRADD MERLOS MRN: [id number] Date of Birth: 12-17-1956

## 2019-10-09 ENCOUNTER — Ambulatory Visit: Payer: Medicare Other | Admitting: Physical Therapy

## 2019-10-09 ENCOUNTER — Encounter: Payer: Self-pay | Admitting: Physical Therapy

## 2019-10-09 ENCOUNTER — Other Ambulatory Visit: Payer: Self-pay

## 2019-10-09 DIAGNOSIS — M6281 Muscle weakness (generalized): Secondary | ICD-10-CM

## 2019-10-09 DIAGNOSIS — M542 Cervicalgia: Secondary | ICD-10-CM | POA: Diagnosis not present

## 2019-10-09 NOTE — Therapy (Signed)
[facility] Outpatient Rehabilitation [facility] [address] [place], [place], [address] Phone: [phone]   Fax:  [phone]  Physical Therapy Treatment  Patient Details  Name: Seth Adams MRN: [id number] Date of Birth: 07/17/1956 Referring Provider (PT): York Spaniel, MD    Encounter Date: 10/09/2019  PT End of Session - 10/09/19 1020    Visit Number  2    Number of Visits  13    Date for PT Re-Evaluation  12/02/19    Authorization Type  self pay    PT Start Time  1020    PT Stop Time  1100    PT Time Calculation (min)  40 min    Activity Tolerance  Patient tolerated treatment well    Behavior During Therapy  [facility] for tasks assessed/performed       Past Medical History:  Diagnosis Date  . Cervical spondylosis 02/07/2019    Past Surgical History:  Procedure Laterality Date  . BACK SURGERY     Lower back surgery x2  . CATARACT EXTRACTION W/ INTRAOCULAR LENS  IMPLANT, BILATERAL    . eyelid lift  1   2019 [facility]  . FOOT SURGERY      There were no vitals filed for this visit.  Subjective Assessment - 10/09/19 1022    Subjective  " I am doing much better today, still some soreness in the shoulders but it really is alot better"    Patient Stated Goals  to decrase pain and tightness    Currently in Pain?  Yes                       [facility] Adult PT Treatment/Exercise - 10/09/19 0001      Neck Exercises: Machines for Strengthening   UBE (Upper Arm Bike)  L4 x 6 min    changing direction at 3 min     Neck Exercises: Supine   Other Supine Exercise  shoulder flexion with pulling out on pillates ring, pushing in on pilates ring 2 x 15      Manual Therapy   Manual Therapy  Taping;Other (comment)    Manual therapy comments  Skilled palpation and monitoring of pt throughout TPDN    Joint Mobilization  grade III PA T1-T7    Soft tissue mobilization  IASTM along bil upper trap/ levator scapuale    Other Manual Therapy   bil upper trap inhibtion taping      Neck Exercises: Stretches   Upper Trapezius Stretch  2 reps;30 seconds;Right;Left    Levator Stretch  1 rep;Left;Right;30 seconds       Trigger Point Dry Needling - 10/09/19 0001    Consent Given?  Yes    Education Handout Provided  Yes    Muscles Treated Head and Neck  Upper trapezius;Levator scapulae;Semispinalis capitus    Electrical Stimulation Performed with Dry Needling  Yes    E-stim with Dry Needling Details  frequnecy at 12, x 10 min increasing to tolerance    Upper Trapezius Response  Twitch reponse elicited;Palpable increased muscle length             PT Short Term Goals - 10/07/19 1237      PT SHORT TERM GOAL #1   Title  Independent with initial hEP    Time  3    Period  Weeks    Status  New    Target Date  10/28/19        PT  Long Term Goals - 10/07/19 1237      PT LONG TERM GOAL #1   Title  increase cervical R Sidebending/ flexion  to [facility] with </= 2/10 pain for functional ROM required for ADLs    Time  6    Period  Weeks    Status  New    Target Date  11/18/19      PT LONG TERM GOAL #2   Title  reduce cervical muscle spasm to redcue pain to </= 1/10 for QOL    Time  6    Period  Weeks    Status  New    Target Date  11/18/19      PT LONG TERM GOAL #3   Title  increase FOTO score to </= 36% limited to demo improvement in function    Time  6    Period  Weeks    Status  New    Target Date  11/18/19      PT LONG TERM GOAL #4   Title  pt to be I with all HEP given as of last visit to maintain and progress current level of function    Time  6    Period  Weeks    Status  New    Target Date  11/18/19            Plan - 10/09/19 1051    Clinical Impression Statement  pt reports significant improvement since the last session but conitnues to report stiffness. continued TPDN combined with E-stim set at twitch to fatigue the muscle. continued taping and shoulder strengthening to promote retraining.    PT  Treatment/Interventions  ADLs/Self Care Home Management;Electrical Stimulation;Iontophoresis 4mg /ml Dexamethasone;Moist Heat;Traction;Ultrasound;Therapeutic activities;Therapeutic exercise;Balance training;Patient/family education;Manual techniques;Dry needling;Taping;Spinal Manipulations    PT Next Visit Plan  review/ update HEP PRN, cervicothoracic mobs, shoulder strengthening, cervical stability, posture/ lifting mechanics, response to TPDN with stim    PT Home Exercise Plan  previously provided HEP       Patient will benefit from skilled therapeutic intervention in order to improve the following deficits and impairments:  Pain, Postural dysfunction, Improper body mechanics, Increased muscle spasms, Decreased range of motion, Decreased activity tolerance, Decreased endurance  Visit Diagnosis: Cervicalgia  Muscle weakness (generalized)     Problem List Patient Active Problem List   Diagnosis Date Noted  . Cervical spondylosis 02/07/2019   Starr Lake PT, DPT, LAT, ATC  10/09/19  10:54 AM      [place] [facility] [address] [place], [place], [address] Phone: [phone]   Fax:  [phone]  Name: Seth Adams MRN: [id number] Date of Birth: [birth year]

## 2019-10-21 ENCOUNTER — Encounter: Payer: Self-pay | Admitting: Physical Therapy

## 2019-10-21 ENCOUNTER — Ambulatory Visit: Payer: Medicare Other | Admitting: Physical Therapy

## 2019-10-21 ENCOUNTER — Other Ambulatory Visit: Payer: Self-pay

## 2019-10-21 DIAGNOSIS — M542 Cervicalgia: Secondary | ICD-10-CM | POA: Diagnosis not present

## 2019-10-21 DIAGNOSIS — M6281 Muscle weakness (generalized): Secondary | ICD-10-CM

## 2019-10-21 NOTE — Therapy (Signed)
[place], [place], [address] Phone: [phone]   Fax:  [phone]  Physical Therapy Treatment  Patient Details  Name: Seth Adams MRN: [id number] Date of Birth: 03-21-1956 Referring Provider (PT): Kathrynn Ducking, MD    Encounter Date: 10/21/2019  PT End of Session - 10/21/19 1628    Visit Number  3    Number of Visits  13    Date for PT Re-Evaluation  12/02/19    Authorization Type  self pay    PT Start Time  1555   pt arrived 10 min late   PT Stop Time  1628    PT Time Calculation (min)  33 min    Behavior During Therapy  [facility] for tasks assessed/performed       Past Medical History:  Diagnosis Date  . Cervical spondylosis 02/07/2019    Past Surgical History:  Procedure Laterality Date  . BACK SURGERY     Lower back surgery x2  . CATARACT EXTRACTION W/ INTRAOCULAR LENS  IMPLANT, BILATERAL    . eyelid lift  1   2019 [place]  . FOOT SURGERY      There were no vitals filed for this visit.  Subjective Assessment - 10/21/19 1556    Subjective  "I am having still about 5/10 in the shoulder from doing alot of cabinet lifting over the weekend"    Patient Stated Goals  to decrase pain and tightness    Currently in Pain?  Yes    Pain Score  5     Pain Orientation  Right;Left    Pain Type  Chronic pain    Pain Onset  More than a month ago    Pain Frequency  Intermittent    Aggravating Factors   in the afternoon         [facility] PT Assessment - 10/21/19 0001      Assessment   Medical Diagnosis  Cervicalgia    Referring Provider (PT)  Kathrynn Ducking, MD                    [facility] Adult PT Treatment/Exercise - 10/21/19 0001      Modalities   Modalities  Iontophoresis      Iontophoresis   Type of Iontophoresis  Dexamethasone    Location  L levator scapulae    Dose  4mg /ml    Time  6 hour patch      Manual Therapy   Manual Therapy  Myofascial release    Manual therapy comments  Skilled palpation and monitoring of pt throughout TPDN    Joint Mobilization  grade III PA T1-T7    Soft tissue mobilization  IASTM along bil upper trap/ levator scapuale    Myofascial Release  fascial stretching/ rolling along L upper trap/ levator and Rhomboids      Neck Exercises: Stretches   Upper Trapezius Stretch  2 reps;30 seconds;Right;Left    Levator Stretch  1 rep;Left;Right;30 seconds       Trigger Point Dry Needling - 10/21/19 0001    Consent Given?  Yes    Education Handout Provided  Previously provided    E-stim with Dry Needling Details  pointer stim tool: frequency 5, L4     Upper Trapezius Response  Twitch reponse elicited;Palpable increased muscle length    Levator Scapulae Response  Twitch response elicited;Palpable increased muscle length  PT Short Term Goals - 10/07/19 1237      PT SHORT TERM GOAL #1   Title  Independent with initial hEP    Time  3    Period  Weeks    Status  New    Target Date  10/28/19        PT Long Term Goals - 10/07/19 1237      PT LONG TERM GOAL #1   Title  increase cervical R Sidebending/ flexion  to [facility] with </= 2/10 pain for functional ROM required for ADLs    Time  6    Period  Weeks    Status  New    Target Date  11/18/19      PT LONG TERM GOAL #2   Title  reduce cervical muscle spasm to redcue pain to </= 1/10 for QOL    Time  6    Period  Weeks    Status  New    Target Date  11/18/19      PT LONG TERM GOAL #3   Title  increase FOTO score to </= 36% limited to demo improvement in function    Time  6    Period  Weeks    Status  New    Target Date  11/18/19      PT LONG TERM GOAL #4   Title  pt to be I with all HEP given as of last visit to maintain and progress current level of function    Time  6    Period  Weeks    Status  New    Target Date  11/18/19            Plan - 10/21/19 1629    Clinical Impression Statement  pt arrived 10 min late today. pt  reported doing more lifting over the weekend to hangshelves and had increased pain / tightness in the L upper trap/ levator scapulae. focused on pain / spasm relief utilzing TPDN combined with the pointer tool to promote relief. educated and consent was provided for ionto for the L shoulder.    PT Treatment/Interventions  ADLs/Self Care Home Management;Electrical Stimulation;Iontophoresis 4mg /ml Dexamethasone;Moist Heat;Traction;Ultrasound;Therapeutic activities;Therapeutic exercise;Balance training;Patient/family education;Manual techniques;Dry needling;Taping;Spinal Manipulations    PT Next Visit Plan  review/ update HEP PRN, cervicothoracic mobs, shoulder strengthening, cervical stability, posture/ lifting mechanics, response to TPDN with stim, response to ionto    PT Home Exercise Plan  previously provided HEP    Consulted and Agree with Plan of Care  Patient       Patient will benefit from skilled therapeutic intervention in order to improve the following deficits and impairments:  Pain, Postural dysfunction, Improper body mechanics, Increased muscle spasms, Decreased range of motion, Decreased activity tolerance, Decreased endurance  Visit Diagnosis: Cervicalgia  Muscle weakness (generalized)     Problem List Patient Active Problem List   Diagnosis Date Noted  . Cervical spondylosis 02/07/2019   02/09/2019 PT, DPT, LAT, ATC  10/21/19  5:31 PM      [facility] [address] [place], [place], [place] Phone: [phone]   Fax:  [phone]  Name: Seth Adams MRN: Fayne Mediate Date of Birth: 1956/09/11

## 2019-10-24 ENCOUNTER — Ambulatory Visit: Payer: Medicare Other | Admitting: Physical Therapy

## 2019-10-28 ENCOUNTER — Encounter: Payer: Self-pay | Admitting: Physical Therapy

## 2019-10-28 ENCOUNTER — Other Ambulatory Visit: Payer: Self-pay

## 2019-10-28 ENCOUNTER — Ambulatory Visit: Payer: Medicare Other | Attending: Neurology | Admitting: Physical Therapy

## 2019-10-28 DIAGNOSIS — M6281 Muscle weakness (generalized): Secondary | ICD-10-CM | POA: Diagnosis present

## 2019-10-28 DIAGNOSIS — M542 Cervicalgia: Secondary | ICD-10-CM | POA: Diagnosis present

## 2019-10-28 NOTE — Therapy (Signed)
[place], [place], [address] Phone: [phone]   Fax:  [phone]  Physical Therapy Treatment  Patient Details  Name: Seth Adams MRN: [id number] Date of Birth: 1956/07/27 Referring Provider (PT): Kathrynn Ducking, MD    Encounter Date: 10/28/2019  PT End of Session - 10/28/19 1418    Visit Number  4    Number of Visits  13    Date for PT Re-Evaluation  12/02/19    Authorization Type  self pay    PT Start Time  1416    PT Stop Time  1455    PT Time Calculation (min)  39 min    Activity Tolerance  Patient tolerated treatment well    Behavior During Therapy  [facility] for tasks assessed/performed       Past Medical History:  Diagnosis Date  . Cervical spondylosis 02/07/2019    Past Surgical History:  Procedure Laterality Date  . BACK SURGERY     Lower back surgery x2  . CATARACT EXTRACTION W/ INTRAOCULAR LENS  IMPLANT, BILATERAL    . eyelid lift  1   2019 [place]  . FOOT SURGERY      There were no vitals filed for this visit.  Subjective Assessment - 10/28/19 1418    Subjective  "I am doing better today but it took a few days for the pain to go away"    Currently in Pain?  Yes    Pain Score  4     Pain Location  Neck    Pain Orientation  Right;Left    Pain Descriptors / Indicators  Aching;Sore    Pain Type  Chronic pain    Pain Onset  More than a month ago    Pain Frequency  Intermittent    Aggravating Factors   lifting                       [facility] Adult PT Treatment/Exercise - 10/28/19 0001      Shoulder Exercises: Supine   Protraction  Strengthening;Left;15 reps;Weights   with CW/CCW     Shoulder Exercises: Seated   Other Seated Exercises  shoulder coracobrachialis into overhead press 1 x 10   focus on avoiding shoulder hiking   Other Seated Exercises  money 1 x 15 with green theraband   cues to keep elbows tucked at side for proper form     Iontophoresis   Type of Iontophoresis  Dexamethasone    Location  L levator scapulae    Dose  4mg /ml    Time  6 hour patch      Manual Therapy   Manual therapy comments  Skilled palpation and monitoring of pt throughout TPDN    Joint Mobilization  grade III PA T1-T7    Soft tissue mobilization  IASTM along bil upper trap/ levator scapuale, and L shoulder infraspinatus       Trigger Point Dry Needling - 10/28/19 0001    Consent Given?  Yes    Education Handout Provided  Previously provided    Muscles Treated Upper Quadrant  Infraspinatus    E-stim with Dry Needling Details  pointer stim tool: frequency 5, L4     Upper Trapezius Response  Twitch reponse elicited;Palpable increased muscle length    Levator Scapulae Response  Twitch response elicited;Palpable increased muscle length    Infraspinatus Response  Twitch response elicited;Palpable increased muscle length  PT Education - 10/28/19 1453    Education Details  updated HEP    Person(s) Educated  Patient    Methods  Explanation;Verbal cues;Handout    Comprehension  Verbalized understanding;Verbal cues required       PT Short Term Goals - 10/07/19 1237      PT SHORT TERM GOAL #1   Title  Independent with initial hEP    Time  3    Period  Weeks    Status  New    Target Date  10/28/19        PT Long Term Goals - 10/07/19 1237      PT LONG TERM GOAL #1   Title  increase cervical R Sidebending/ flexion  to [facility] with </= 2/10 pain for functional ROM required for ADLs    Time  6    Period  Weeks    Status  New    Target Date  11/18/19      PT LONG TERM GOAL #2   Title  reduce cervical muscle spasm to redcue pain to </= 1/10 for QOL    Time  6    Period  Weeks    Status  New    Target Date  11/18/19      PT LONG TERM GOAL #3   Title  increase FOTO score to </= 36% limited to demo improvement in function    Time  6    Period  Weeks    Status  New    Target Date  11/18/19      PT LONG TERM GOAL #4    Title  pt to be I with all HEP given as of last visit to maintain and progress current level of function    Time  6    Period  Weeks    Status  New    Target Date  11/18/19            Plan - 10/28/19 1454    Clinical Impression Statement  pt continues to report L shoulder /neck pain following lifting activities around the house. continued TPDN combined with E-stim pointer tool. continued thoracic mobs and scapular stability exercises to promote stability. practiced single arm overhead lifting which he did well with but did fatigue quickly. end of session he noted soreness but improvement in overall pain.    PT Treatment/Interventions  ADLs/Self Care Home Management;Electrical Stimulation;Iontophoresis 4mg /ml Dexamethasone;Moist Heat;Traction;Ultrasound;Therapeutic activities;Therapeutic exercise;Balance training;Patient/family education;Manual techniques;Dry needling;Taping;Spinal Manipulations    PT Next Visit Plan  review/ update HEP PRN, cervicothoracic mobs, shoulder strengthening, cervical stability, posture/ lifting mechanics especially overhead lifting and carrying activities, response to TPDN with stim, continue ionto,    PT Home Exercise Plan  previously provided HEP CODE: [id number] lower trap and shoulder protraction    Consulted and Agree with Plan of Care  Patient       Patient will benefit from skilled therapeutic intervention in order to improve the following deficits and impairments:  Pain, Postural dysfunction, Improper body mechanics, Increased muscle spasms, Decreased range of motion, Decreased activity tolerance, Decreased endurance  Visit Diagnosis: Cervicalgia  Muscle weakness (generalized)     Problem List Patient Active Problem List   Diagnosis Date Noted  . Cervical spondylosis 02/07/2019    02/09/2019 PT, DPT, LAT, ATC  10/28/19  2:59 PM      [facility] [address] [place],  [place], [place] Phone: [phone]   Fax:  [phone]  Name: Seth  Gaetana MichaelisL Adams MRN: [id number] Date of Birth: 06/03/1956

## 2019-10-31 ENCOUNTER — Other Ambulatory Visit: Payer: Self-pay

## 2019-10-31 ENCOUNTER — Ambulatory Visit: Payer: Medicare Other | Admitting: Physical Therapy

## 2019-10-31 DIAGNOSIS — M542 Cervicalgia: Secondary | ICD-10-CM | POA: Diagnosis not present

## 2019-10-31 DIAGNOSIS — M6281 Muscle weakness (generalized): Secondary | ICD-10-CM

## 2019-10-31 NOTE — Therapy (Signed)
[facility] [address] [place], [place], [address] Phone: [phone]   Fax:  [phone]  Physical Therapy Treatment  Patient Details  Name: Seth Adams MRN: [id number] Date of Birth: 1956/04/18 Referring Provider (PT): York Spaniel, MD    Encounter Date: 10/31/2019  PT End of Session - 10/31/19 1150    Visit Number  5    Number of Visits  13    Date for PT Re-Evaluation  12/02/19    Authorization Type  self pay    PT Start Time  1148    PT Stop Time  1230    PT Time Calculation (min)  42 min    Activity Tolerance  Patient tolerated treatment well    Behavior During Therapy  [facility] for tasks assessed/performed       Past Medical History:  Diagnosis Date  . Cervical spondylosis 02/07/2019    Past Surgical History:  Procedure Laterality Date  . BACK SURGERY     Lower back surgery x2  . CATARACT EXTRACTION W/ INTRAOCULAR LENS  IMPLANT, BILATERAL    . eyelid lift  1   2019 [facility]  . FOOT SURGERY      There were no vitals filed for this visit.  Subjective Assessment - 10/31/19 1151    Subjective  "I have been having increased soreness in the shoulder, and it is worse with sleeping posititoin"    Patient Stated Goals  to decrase pain and tightness    Currently in Pain?  Yes    Pain Score  4     Pain Location  Neck    Pain Orientation  Left    Pain Descriptors / Indicators  Aching;Sore         [facility] PT Assessment - 10/31/19 0001      Assessment   Medical Diagnosis  Cervicalgia    Referring Provider (PT)  York Spaniel, MD                    [facility] Adult PT Treatment/Exercise - 10/31/19 0001      Modalities   Modalities  Ultrasound      Ultrasound   Ultrasound Location  L levator scapulae    Ultrasound Parameters  @ 100%  .8 w/cm2       Manual Therapy   Manual therapy comments  Skilled palpation and monitoring of pt throughout TPDN    Joint Mobilization  grade  III PA T1-T7, L first rib grade III inferior mobs    Soft tissue mobilization  IASTM along bil upper trap/ levator scapuale, and L shoulder infraspinatus    Myofascial Release  fascial stretching / rolling over the L levator scapuale/ upper trap      Neck Exercises: Stretches   Upper Trapezius Stretch  2 reps;30 seconds;Right;Left    Levator Stretch  3 reps;30 seconds;Left   demonstration for proper ofrm      Trigger Point Dry Needling - 10/31/19 0001    Consent Given?  Yes    Education Handout Provided  Previously provided    Levator Scapulae Response  Twitch response elicited;Palpable increased muscle length             PT Short Term Goals - 10/07/19 1237      PT SHORT TERM GOAL #1   Title  Independent with initial hEP    Time  3    Period  Weeks    Status  New  Target Date  10/28/19        PT Long Term Goals - 10/07/19 1237      PT LONG TERM GOAL #1   Title  increase cervical R Sidebending/ flexion  to [facility] with </= 2/10 pain for functional ROM required for ADLs    Time  6    Period  Weeks    Status  New    Target Date  11/18/19      PT LONG TERM GOAL #2   Title  reduce cervical muscle spasm to redcue pain to </= 1/10 for QOL    Time  6    Period  Weeks    Status  New    Target Date  11/18/19      PT LONG TERM GOAL #3   Title  increase FOTO score to </= 36% limited to demo improvement in function    Time  6    Period  Weeks    Status  New    Target Date  11/18/19      PT LONG TERM GOAL #4   Title  pt to be I with all HEP given as of last visit to maintain and progress current level of function    Time  6    Period  Weeks    Status  New    Target Date  11/18/19            Plan - 10/31/19 1311    Clinical Impression Statement  pt reports continued stiffness / pain inthe L upper trap/ levsator scapulae with signifcant trigger points noted. focused session on pain relief. continued TPDN focusing only on the levator scapuale followed with IASTM  techn[employer]. applied [place] to promote deep heat followed with stretching. reapplied a ionto patch to calm down inflammation. pt reported decreased pain at the end of the session.    PT Treatment/Interventions  ADLs/Self Care Home Management;Electrical Stimulation;Iontophoresis 4mg /ml Dexamethasone;Moist Heat;Traction;Ultrasound;Therapeutic activities;Therapeutic exercise;Balance training;Patient/family education;Manual techniques;Dry needling;Taping;Spinal Manipulations    PT Next Visit Plan  review/ update HEP PRN, cervicothoracic mobs, shoulder strengthening, cervical stability, posture/ lifting mechanics especially overhead lifting and carrying activities, response to TPDN with stim, continue ionto,    PT Home Exercise Plan  previously provided HEP CODE: [id number] lower trap and shoulder protraction    Consulted and Agree with Plan of Care  Patient       Patient will benefit from skilled therapeutic intervention in order to improve the following deficits and impairments:  Pain, Postural dysfunction, Improper body mechanics, Increased muscle spasms, Decreased range of motion, Decreased activity tolerance, Decreased endurance  Visit Diagnosis: Cervicalgia  Muscle weakness (generalized)     Problem List Patient Active Problem List   Diagnosis Date Noted  . Cervical spondylosis 02/07/2019   [place] PT, DPT, LAT, ATC  10/31/19  1:14 PM      [place] [facility] [address] [place], [place], [address] Phone: [phone]   Fax:  [phone]  Name: Seth Adams MRN: [id number] Date of Birth: 06-09-1956

## 2019-11-04 ENCOUNTER — Other Ambulatory Visit: Payer: Self-pay

## 2019-11-04 ENCOUNTER — Encounter: Payer: Self-pay | Admitting: Physical Therapy

## 2019-11-04 ENCOUNTER — Ambulatory Visit: Payer: Medicare Other | Admitting: Physical Therapy

## 2019-11-04 DIAGNOSIS — M542 Cervicalgia: Secondary | ICD-10-CM | POA: Diagnosis not present

## 2019-11-04 DIAGNOSIS — M6281 Muscle weakness (generalized): Secondary | ICD-10-CM

## 2019-11-04 NOTE — Therapy (Signed)
[facility] [address] [place], [place], [address] Phone: [phone]   Fax:  [phone]  Physical Therapy Treatment  Patient Details  Name: Seth Adams MRN: [id number] Date of Birth: 08/04/1956 Referring Provider (PT): York SpanielWillis, Charles K, MD    Encounter Date: 11/04/2019  PT End of Session - 11/04/19 1143    Visit Number  6    Number of Visits  13    Date for PT Re-Evaluation  12/02/19    Authorization Type  self pay    PT Start Time  1144    PT Stop Time  1226    PT Time Calculation (min)  42 min    Activity Tolerance  Patient tolerated treatment well    Behavior During Therapy  [facility] for tasks assessed/performed       Past Medical History:  Diagnosis Date  . Cervical spondylosis 02/07/2019    Past Surgical History:  Procedure Laterality Date  . BACK SURGERY     Lower back surgery x2  . CATARACT EXTRACTION W/ INTRAOCULAR LENS  IMPLANT, BILATERAL    . eyelid lift  1   [facility]  . FOOT [facility]GERY      There were no vitals filed for this visit.  Subjective Assessment - 11/04/19 1144    Subjective  "I am doing much better today and thats even despite doing all the stuff I did this weekend".    Patient Stated Goals  to decrase pain and tightness    Currently in Pain?  Yes    Pain Score  2     Pain Location  Neck    Pain Orientation  Left    Pain Descriptors / Indicators  Aching    Pain Type  Chronic pain    Pain Onset  More than a month ago    Pain Frequency  Intermittent         [facility] PT Assessment - 11/04/19 0001      Assessment   Medical Diagnosis  Cervicalgia    Referring Provider (PT)  York SpanielWillis, Charles K, MD                    [facility] Adult [facility] Treatment/Exercise - 11/04/19 0001      Neck Exercises: Machines for Strengthening   UBE (Upper Arm Bike)  L6 x 5 min    changing direction at 2:30 sec     Shoulder Exercises: Standing   External Rotation  Strengthening;Left;15  reps;Theraband    Theraband Level (Shoulder External Rotation)  Level 4 (Blue)    Internal Rotation  Strengthening;15 reps;Theraband    Theraband Level (Shoulder Internal Rotation)  Level 4 (Blue)      Ultrasound   Ultrasound Location  L levator scapulae    Ultrasound Parameters  1mhz @ 100%  1.0 w/cm2     Ultrasound Goals  Pain      Iontophoresis   Type of Iontophoresis  Dexamethasone    Location  L levator scapulae    Dose  4mg /ml    Time  6 hour patch      Manual Therapy   Manual Therapy  Myofascial release    Joint Mobilization  grade III PA T1-T7, L first rib grade III inferior mobs    Soft tissue mobilization  IASTM along bil upper trap/ levator scapuale, and L shoulder infraspinatus      Neck Exercises: Stretches   Upper Trapezius Stretch  --    Levator  Stretch  2 reps    Other Neck Stretches  door way pec stretch 2 x 30 sec               PT Short Term Goals - 10/07/19 1237      PT SHORT TERM GOAL #1   Title  Independent with initial hEP    Time  3    Period  Weeks    Status  New    Target Date  10/28/19        PT Long Term Goals - 10/07/19 1237      PT LONG TERM GOAL #1   Title  increase cervical R Sidebending/ flexion  to [facility] with </= 2/10 pain for functional ROM required for ADLs    Time  6    Period  Weeks    Status  New    Target Date  11/18/19      PT LONG TERM GOAL #2   Title  reduce cervical muscle spasm to redcue pain to </= 1/10 for QOL    Time  6    Period  Weeks    Status  New    Target Date  11/18/19      PT LONG TERM GOAL #3   Title  increase FOTO score to </= 36% limited to demo improvement in function    Time  6    Period  Weeks    Status  New    Target Date  11/18/19      PT LONG TERM GOAL #4   Title  pt to be I with all HEP given as of last visit to maintain and progress current level of function    Time  6    Period  Weeks    Status  New    Target Date  11/18/19            Plan - 11/04/19 1230    Clinical  Impression Statement  Mr. Bergfeld reports significant improvement today reporting pain at 2/10. opted to hold off on DN today to assess response to exercise without that intervention. continued [place] to promote healing and scapular stabilization exercises which he responded well to. pt to return back in 2 weeks to assess response to treatment wihtout PT intervenion and if doing well may potentially discharge.    PT Treatment/Interventions  ADLs/Self Care Home Management;Electrical Stimulation;Iontophoresis 4mg /ml Dexamethasone;Moist Heat;Traction;Ultrasound;Therapeutic activities;Therapeutic exercise;Balance training;Patient/family education;Manual techniques;Dry needling;Taping;Spinal Manipulations    PT Next Visit Plan  review/ update HEP PRN, cervicothoracic mobs, shoulder strengthening, cervical stability, posture/ lifting mechanics especially overhead lifting and carrying activities, response to TPDN with stim, continue ionto,    PT Home Exercise Plan  previously provided HEP CODE: [id number] lower trap and shoulder protraction    Consulted and Agree with Plan of Care  Patient       Patient will benefit from skilled therapeutic intervention in order to improve the following deficits and impairments:  Pain, Postural dysfunction, Improper body mechanics, Increased muscle spasms, Decreased range of motion, Decreased activity tolerance, Decreased endurance  Visit Diagnosis: Cervicalgia  Muscle weakness (generalized)     Problem List Patient Active Problem List   Diagnosis Date Noted  . Cervical spondylosis 02/07/2019   02/09/2019 PT, DPT, LAT, ATC  11/04/19  12:33 PM      [facility] [address] [place], [place], [place] Phone: [phone]   Fax:  [phone]  Name: Seth Adams MRN: Seth Adams Date of  Birth: [birth year]

## 2019-11-07 ENCOUNTER — Ambulatory Visit: Payer: Medicare Other | Admitting: Physical Therapy

## 2019-11-07 ENCOUNTER — Other Ambulatory Visit: Payer: Self-pay

## 2019-11-07 DIAGNOSIS — M542 Cervicalgia: Secondary | ICD-10-CM

## 2019-11-07 DIAGNOSIS — M6281 Muscle weakness (generalized): Secondary | ICD-10-CM

## 2019-11-07 NOTE — Therapy (Signed)
[facility] [address] [place], [place], [address] Phone: [phone]   Fax:  [phone]  Physical Therapy Treatment  Patient Details  Name: Seth Adams MRN: [id number] Date of Birth: [birth year] Referring Provider (PT): York Spaniel, MD    Encounter Date: 11/07/2019  PT End of Session - 11/07/19 1058    Visit Number  7    Number of Visits  13    Date for PT Re-Evaluation  12/02/19    Authorization Type  self pay    PT Start Time  1058    PT Stop Time  1142    PT Time Calculation (min)  44 min    Activity Tolerance  Patient tolerated treatment well    Behavior During Therapy  [facility] for tasks assessed/performed       Past Medical History:  Diagnosis Date  . Cervical spondylosis 02/07/2019    Past Surgical History:  Procedure Laterality Date  . BACK SURGERY     Lower back surgery x2  . CATARACT EXTRACTION W/ INTRAOCULAR LENS  IMPLANT, BILATERAL    . eyelid lift  1   2019 [facility]  . FOOT SURGERY      There were no vitals filed for this visit.  Subjective Assessment - 11/07/19 1058    Subjective  "I noticed some soreness this morning, I do feel better not as tense"    Patient Stated Goals  to decrase pain and tightness    Currently in Pain?  Yes    Pain Score  2     Pain Location  Neck    Pain Orientation  Left    Pain Descriptors / Indicators  Aching         [facility] PT Assessment - 11/07/19 0001      Assessment   Medical Diagnosis  Cervicalgia    Referring Provider (PT)  York Spaniel, MD                    [facility] Adult PT Treatment/Exercise - 11/07/19 0001      Self-Care   Self-Care  Posture    Posture  sleeping positionig to avoid over activation of the L upper trap, laying on the R.      Exercises   Exercises  Shoulder      Shoulder Exercises: Standing   Other Standing Exercises  low row 2 x 10 5# demosntration for proper form and cues for technique.    Other  Standing Exercises  TRX rows 2 x 10   demonstration for form     Shoulder Exercises: Power [employer] consultant  lat pull down 2 x 10 25#   tactile cues for proper form   Other Power [employer]  rows wide/ narrow girl 1 x 15 ea. with 25#      Neck Exercises: Stretches   Upper Trapezius Stretch  2 reps;30 seconds;Left    Levator Stretch  2 reps;30 seconds;Left             PT Education - 11/07/19 1159    Education Details  sleeping posture/ positioning.    Person(s) Educated  Patient    Methods  Explanation;Verbal cues    Comprehension  Verbalized understanding;Verbal cues required       PT Short Term Goals - 10/07/19 1237      PT SHORT TERM GOAL #1   Title  Independent with initial hEP    Time  3    Period  Weeks    Status  New    Target Date  10/28/19        PT Long Term Goals - 10/07/19 1237      PT LONG TERM GOAL #1   Title  increase cervical R Sidebending/ flexion  to [facility] with </= 2/10 pain for functional ROM required for ADLs    Time  6    Period  Weeks    Status  New    Target Date  11/18/19      PT LONG TERM GOAL #2   Title  reduce cervical muscle spasm to redcue pain to </= 1/10 for QOL    Time  6    Period  Weeks    Status  New    Target Date  11/18/19      PT LONG TERM GOAL #3   Title  increase FOTO score to </= 36% limited to demo improvement in function    Time  6    Period  Weeks    Status  New    Target Date  11/18/19      PT LONG TERM GOAL #4   Title  pt to be I with all HEP given as of last visit to maintain and progress current level of function    Time  6    Period  Weeks    Status  New    Target Date  11/18/19            Plan - 11/07/19 1156    Clinical Impression Statement  Mr. Hur reported some soreness this morning which may be attributed to his sleeping positioning and discussed alternatives to reduce abnormal tension. focused remaining sessoin on shoulder strengthening which he did require  frequent verbal cues on proper form. end of session he noted no pain.    PT Treatment/Interventions  ADLs/Self Care Home Management;Electrical Stimulation;Iontophoresis 4mg /ml Dexamethasone;Moist Heat;Traction;Ultrasound;Therapeutic activities;Therapeutic exercise;Balance training;Patient/family education;Manual techniques;Dry needling;Taping;Spinal Manipulations    PT Next Visit Plan  review HEP, update PRN, ROM, goals, if doing well D/C, discussed sleeping positioning.    PT Home Exercise Plan  previously provided HEP CODE: [id number] lower trap and shoulder protraction       Patient will benefit from skilled therapeutic intervention in order to improve the following deficits and impairments:  Pain, Postural dysfunction, Improper body mechanics, Increased muscle spasms, Decreased range of motion, Decreased activity tolerance, Decreased endurance  Visit Diagnosis: Cervicalgia  Muscle weakness (generalized)     Problem List Patient Active Problem List   Diagnosis Date Noted  . Cervical spondylosis 02/07/2019   Starr Lake PT, DPT, LAT, ATC  11/07/19  12:00 PM      [facility] [address] [place], [place], [address] Phone: [phone]   Fax:  [phone]  Name: Seth Adams MRN: [id number] Date of Birth: 05-Feb-1956

## 2019-11-25 ENCOUNTER — Encounter: Payer: Self-pay | Admitting: Physical Therapy

## 2019-11-25 ENCOUNTER — Ambulatory Visit: Payer: Medicare Other | Admitting: Physical Therapy

## 2019-11-25 ENCOUNTER — Other Ambulatory Visit: Payer: Self-pay

## 2019-11-25 DIAGNOSIS — M6281 Muscle weakness (generalized): Secondary | ICD-10-CM

## 2019-11-25 DIAGNOSIS — M542 Cervicalgia: Secondary | ICD-10-CM | POA: Diagnosis not present

## 2019-11-25 NOTE — Therapy (Signed)
[place], [place], [address] Phone: [phone]   Fax:  [phone]  Physical Therapy Treatment / Discharge  Patient Details  Name: Seth Adams MRN: [id number] Date of Birth: 1956-06-30 Referring Provider (PT): Kathrynn Ducking, MD    Encounter Date: 11/25/2019  PT End of Session - 11/25/19 1416    Visit Number  8    Number of Visits  13    Date for PT Re-Evaluation  12/02/19    Authorization Type  self pay    PT Start Time  1415    PT Stop Time  1455    PT Time Calculation (min)  40 min    Activity Tolerance  Patient tolerated treatment well    Behavior During Therapy  [facility] for tasks assessed/performed       Past Medical History:  Diagnosis Date  . Cervical spondylosis 02/07/2019    Past Surgical History:  Procedure Laterality Date  . BACK SURGERY     Lower back surgery x2  . CATARACT EXTRACTION W/ INTRAOCULAR LENS  IMPLANT, BILATERAL    . eyelid lift  1   2019 [place]  . FOOT SURGERY      There were no vitals filed for this visit.  Subjective Assessment - 11/25/19 1420    Subjective  " I fet increased soreness in the shoulder after falling onto the L hand but it feels better"    Patient Stated Goals  to decrase pain and tightness    Currently in Pain?  Yes    Pain Location  Shoulder    Pain Orientation  Left         [facility] PT Assessment - 11/25/19 0001      Assessment   Medical Diagnosis  Cervicalgia    Referring Provider (PT)  Kathrynn Ducking, MD       Observation/Other Assessments   Focus on Therapeutic Outcomes (FOTO)   36% limited                   [facility] Adult PT Treatment/Exercise - 11/25/19 0001      Neck Exercises: Machines for Strengthening   UBE (Upper Arm Bike)  L5 x 4 min backward only      Shoulder Exercises: Standing   External Rotation  Strengthening;Left;15 reps;Theraband    Theraband Level (Shoulder External Rotation)  Level 4  (Blue)    Internal Rotation  Strengthening;15 reps;Theraband    Theraband Level (Shoulder Internal Rotation)  Level 4 (Blue)    Other Standing Exercises  TRX rows 1 x 10, push up 1 x 10   cues to avoid hiking the shoulder     Shoulder Exercises: Power Warden/ranger Exercises  rows wide grip 1 x 12 30#, narrow grip 1 x 12 30#    Other Power [employer] Exercises  lower row 1 x 10 5# tactile cues for proper form/ rationale.              PT Education - 11/25/19 1502    Education Details  Reviewed HEP and discussed appropriate progression of strengthening to maintain and progress strength/ endurance    Person(s) Educated  Patient    Methods  Explanation;Verbal cues;Handout    Comprehension  Verbalized understanding;Verbal cues required       PT Short Term Goals - 11/25/19 1425      PT SHORT TERM GOAL #1   Title  Independent with initial hEP  Period  Weeks    Status  Achieved        PT Long Term Goals - 11/25/19 1426      PT LONG TERM GOAL #1   Title  increase cervical R Sidebending/ flexion  to [facility] with </= 2/10 pain for functional ROM required for ADLs    Period  Weeks    Status  Achieved      PT LONG TERM GOAL #2   Title  reduce cervical muscle spasm to redcue pain to </= 1/10 for QOL    Status  Achieved      PT LONG TERM GOAL #3   Title  increase FOTO score to </= 36% limited to demo improvement in function    Period  Weeks    Status  Achieved      PT LONG TERM GOAL #4   Title  pt to be I with all HEP given as of last visit to maintain and progress current level of function    Period  Weeks    Status  Achieved      PT LONG TERM GOAL #5   Title  FOTO score decr to 33% limited or less    Period  Weeks    Status  Achieved            Plan - 11/25/19 1500    Clinical Impression Statement  Mr Seth Adams has made great progress with physical therapy, increased cervical mobility and additionally reports intermittent tightness in the neck. He was able  to do all exercises today and reported no limitations. He met all goals today and is able to maintain and progress his current level of function independently and will be discharged from PT today.    PT Treatment/Interventions  ADLs/Self Care Home Management;Electrical Stimulation;Iontophoresis 45m/ml Dexamethasone;Moist Heat;Traction;Ultrasound;Therapeutic activities;Therapeutic exercise;Balance training;Patient/family education;Manual techniques;Dry needling;Taping;Spinal Manipulations    PT Next Visit Plan  D/C    PT Home Exercise Plan  previously provided HEP CODE: [id number] lower trap and shoulder protraction       Patient will benefit from skilled therapeutic intervention in order to improve the following deficits and impairments:  Pain, Postural dysfunction, Improper body mechanics, Increased muscle spasms, Decreased range of motion, Decreased activity tolerance, Decreased endurance  Visit Diagnosis: Cervicalgia  Muscle weakness (generalized)     Problem List Patient Active Problem List   Diagnosis Date Noted  . Cervical spondylosis 02/07/2019   KStarr LakePT, DPT, LAT, ATC  11/25/19  3:03 PM      [facility] [address]Phone: [phone]  Fax:  [phone] Name: Seth DYKEMAMRN: [id number] of Birth: 323-Jun-1957      PHYSICAL THERAPY DISCHARGE SUMMARY  Visits from Start of Care: 8  Current functional level related to goals / functional outcomes: See goals, FOTO 36% limited   Remaining deficits: Intermittent stiffness in the neck/ L shoulder that reduces with exercise and strengthening.    Education / Equipment: HEP, theraband, posture and lifting mechanics,   Plan: Patient agrees to discharge.  Patient goals were met. Patient is being discharged due to being pleased with the current functional level.  ?????          Otila Starn PT, DPT, LAT, ATC   11/25/19  3:04 PM

## 2020-01-14 ENCOUNTER — Ambulatory Visit
Admission: RE | Admit: 2020-01-14 | Discharge: 2020-01-14 | Disposition: A | Discharge status: Home / Self Care | Payer: Medicare Other | Source: Ambulatory Visit | Attending: Family Medicine | Admitting: Family Medicine

## 2020-01-14 ENCOUNTER — Other Ambulatory Visit: Payer: BLUE CROSS/BLUE SHIELD

## 2020-01-14 ENCOUNTER — Other Ambulatory Visit: Payer: Self-pay | Admitting: Family Medicine

## 2020-01-14 ENCOUNTER — Other Ambulatory Visit: Payer: Self-pay

## 2020-01-14 DIAGNOSIS — R52 Pain, unspecified: Secondary | ICD-10-CM

## 2020-02-13 ENCOUNTER — Ambulatory Visit: Payer: BLUE CROSS/BLUE SHIELD | Admitting: Neurology

## 2020-06-08 ENCOUNTER — Telehealth: Payer: Self-pay | Admitting: Neurology

## 2020-06-08 DIAGNOSIS — M47812 Spondylosis without myelopathy or radiculopathy, cervical region: Secondary | ICD-10-CM

## 2020-06-08 NOTE — Telephone Encounter (Signed)
Pt called wanting to know if he can get set up for dry needling again. Please advise.

## 2020-06-08 NOTE — Telephone Encounter (Signed)
I called pt and he wants a order for dry needling on his shoulders. I stated he was last seen 01/2019 and has no follow up appointment.I stated Dr. Anne Hahn will be in the office on Monday to advise. Pt verbalized understanding.

## 2020-06-09 NOTE — Addendum Note (Signed)
Addended by: York Spaniel on: 06/09/2020 08:43 AM   Modules accepted: Orders

## 2020-06-09 NOTE — Telephone Encounter (Signed)
I called the patient.  I will get physical therapy set up.

## 2020-07-07 ENCOUNTER — Ambulatory Visit: Payer: Medicare Other | Attending: Neurology | Admitting: Physical Therapy

## 2020-07-07 ENCOUNTER — Other Ambulatory Visit: Payer: Self-pay

## 2020-07-07 ENCOUNTER — Encounter: Payer: Self-pay | Admitting: Physical Therapy

## 2020-07-07 DIAGNOSIS — G8929 Other chronic pain: Secondary | ICD-10-CM | POA: Diagnosis present

## 2020-07-07 DIAGNOSIS — M545 Low back pain, unspecified: Secondary | ICD-10-CM

## 2020-07-07 DIAGNOSIS — M6281 Muscle weakness (generalized): Secondary | ICD-10-CM | POA: Diagnosis present

## 2020-07-07 DIAGNOSIS — M542 Cervicalgia: Secondary | ICD-10-CM | POA: Insufficient documentation

## 2020-07-07 NOTE — Therapy (Signed)
[facility] [address] [place], [place], [address] Phone: [phone]   Fax:  [phone]  Physical Therapy Evaluation  Patient Details  Name: Seth Adams MRN: [id number] Date of Birth: 1956-11-21 Referring Provider (PT): York Spaniel, MD    Encounter Date: 07/07/2020   PT End of Session - 07/07/20 1413    Visit Number 1    Number of Visits 17    Date for PT Re-Evaluation 09/01/20    Authorization Type MCR: Kx mod at 15h visit    Progress Note Due on Visit 10    PT Start Time 1414    PT Stop Time 1501    PT Time Calculation (min) 47 min    Activity Tolerance Patient tolerated treatment well    Behavior During Therapy [facility] for tasks assessed/performed           Past Medical History:  Diagnosis Date  . Cervical spondylosis 02/07/2019    Past Surgical History:  Procedure Laterality Date  . BACK SURGERY     Lower back surgery x2  . CATARACT EXTRACTION W/ INTRAOCULAR LENS  IMPLANT, BILATERAL    . eyelid lift  1   2019 [facility]  . FOOT SURGERY      There were no vitals filed for this visit.    Subjective Assessment - 07/07/20 1415    Subjective pt is a 64 y.o M with CC of R neck and shoulder pain and R low back pain. He he reports he did have 2 falls one 3-4 months and 1 fall about a month ago that started the neck pain. He reports the pain in the R low back has been fluctuating. pain is more in the R thoracic region.    How long can you sit comfortably? unlimited    How long can you stand comfortably? unlimited    How long can you walk comfortably? unlimited    Patient Stated Goals to decrease the pain, improve motion of the leg    Currently in Pain? Yes    Pain Score 4     Pain Location Neck    Pain Orientation Right    Pain Descriptors / Indicators Aching;Throbbing    Pain Type Chronic pain    Pain Onset More than a month ago    Pain Frequency Intermittent    Aggravating Factors  rotating      Pain Relieving Factors getting out of position, and resting    Effect of Pain on Daily Activities limited mobility    Multiple Pain Sites Yes    Pain Score 4    Pain Location Back    Pain Orientation Right    Pain Descriptors / Indicators Aching    Pain Type Chronic pain    Pain Onset More than a month ago    Pain Frequency Intermittent    Aggravating Factors  turning to the R    Pain Relieving Factors sitting and resting              [facility] PT Assessment - 07/07/20 0001      Assessment   Medical Diagnosis Cervical spondylosis     Referring Provider (PT) York Spaniel, MD     Onset Date/Surgical Date --   1 month ago   Hand Dominance Right    Next MD Visit make one PRN    Prior Therapy yes   R shoulder     Precautions   Precautions None  Restrictions   Weight Bearing Restrictions No      Balance Screen   Has the patient fallen in the past 6 months Yes    How many times? 2    Has the patient had a decrease in activity level because of a fear of falling?  No    Is the patient reluctant to leave their home because of a fear of falling?  No      Home [employer]ist information centre manager residence    Living Arrangements Spouse/significant other    Available Help at Discharge Family    Type of Home House      Prior Function   Level of Independence Independent    Vocation Self employed      Cognition   Overall Cognitive Status Within Functional Limits for tasks assessed      Observation/Other Assessments   Focus on Therapeutic Outcomes (FOTO)  57% limited   38% limited predicted     Posture/Postural Control   Posture/Postural Control Postural limitations    Postural Limitations Rounded Shoulders;Forward head      ROM / Strength   AROM / PROM / Strength AROM;Strength;PROM      AROM   Overall AROM  Within functional limits for tasks performed    AROM Assessment Site Cervical;Lumbar    Cervical Flexion 22   reproduced concordant pain   Cervical  Extension 40    Cervical - Right Side Bend 22    Cervical - Left Side Bend 22    Cervical - Right Rotation 48    Cervical - Left Rotation 40    Lumbar Flexion 50    Lumbar Extension 20    Lumbar - Right Side Bend 72    Lumbar - Left Side Bend 20    Lumbar - Right Rotation 20      Strength   Strength Assessment Site Shoulder;Hip    Right/Left Shoulder Right;Left    Right/Left Hip Right;Left      Palpation   Palpation comment TTP along bilupper trap / levator scapulae and Rhomboids R>L      Special Tests    Special Tests Sacrolliac Tests    Sacroiliac Tests  Gaenslen's Test      Sacral thrust    Findings Negative      Gaenslen's test   Findings Negative                      Objective measurements completed on examination: See above findings.       [facility] Adult PT Treatment/Exercise - 07/07/20 0001      Exercises   Exercises Neck;Lumbar      Manual Therapy   Manual Therapy Soft tissue mobilization    Manual therapy comments skilled palpation and monitoring of pt throughout TPDN    Soft tissue mobilization DTM along R thoracic paraspinals            Trigger Point Dry Needling - 07/07/20 0001    Consent Given? Yes    Education Handout Provided Yes    Muscles Treated Back/Hip Erector spinae    Erector spinae Response Twitch response elicited;Palpable increased muscle length   R T7-T9                 PT Short Term Goals - 07/07/20 1722      PT SHORT TERM GOAL #1   Title Independent with initial HEP    Time 4    Period Weeks  Status New    Target Date 08/04/20      PT SHORT TERM GOAL #2   Title to assess strength for bil UE/ LE and make goals accordingly    Time 1    Period Weeks    Status New    Target Date 07/14/20             PT Long Term Goals - 07/07/20 1723      PT LONG TERM GOAL #1   Title increase cervical R Sidebending/ flexion  to [facility] with </= 2/10 pain for functional ROM required for ADLs    Time 8    Period  Weeks    Status New    Target Date 09/01/20      PT LONG TERM GOAL #2   Title reduce cervical muscle spasm to redcue pain to </= 1/10 for QOL    Time 8    Period Weeks    Status New    Target Date 09/01/20      PT LONG TERM GOAL #3   Title increase FOTO score to </= 38% limited to demo improvement in function    Time 8    Period Weeks    Status New    Target Date 09/01/20      PT LONG TERM GOAL #4   Title improve trunk mobility in all planes to [facility] with </= 2/10 pain for function    Time 8    Period Weeks    Status New    Target Date 09/01/20      PT LONG TERM GOAL #5   Title pt to be I with all HEP given to maintain and progress current level of functin IND    Time 8    Period Weeks    Status New    Target Date 09/01/20                  Plan - 07/07/20 1434    Clinical Impression Statement pt presents to OPPT with CC of R neck/ thoracic pain and bil low back pain that started about a month ago after falling. He demonstates mild limitation in cervical ROM with reproduction of concordant pain with flexion, and functional trunk mobiity with reproduction of concordant pain with extension/ sidebending. He responded well to TPDN along the R thoracic paraspinals followed with STW. he would benefit from physical therapy to reduce neck/ low back pain, reduce muscle spasm/ tension and maximize overall function by addressing the deficits listed.    Stability/Clinical Decision Making Stable/Uncomplicated    Clinical Decision Making Low    Rehab Potential Good    PT Frequency 2x / week    PT Duration 8 weeks    PT Treatment/Interventions ADLs/Self Care Home Management;Cryotherapy;Electrical Stimulation;Iontophoresis 4mg /ml Dexamethasone;Moist Heat;Traction;Ultrasound;Stair training;Therapeutic activities;Therapeutic exercise;Neuromuscular re-education;Manual techniques;Patient/family education;Passive range of motion;Dry needling;Taping    PT Next Visit Plan review/ update HEP  PRN, review FOTO and provide handout, hip/ knee strength assessment, look at leg length discrepancy?, continued TPDN for bil upper trap/ thoracic mobiltiy,    PT Home Exercise Plan [id number] - upper trap stretch, levator scapular stretch, book opening, LTR    Consulted and Agree with Plan of Care Patient           Patient will benefit from skilled therapeutic intervention in order to improve the following deficits and impairments:  Improper body mechanics, Decreased strength, Postural dysfunction, Pain, Decreased balance, Decreased activity tolerance, Decreased endurance, Decreased range of motion  Visit  Diagnosis: Cervicalgia  Chronic right-sided low back pain, unspecified whether sciatica present  Muscle weakness (generalized)  Chronic bilateral low back pain without sciatica     Problem List Patient Active Problem List   Diagnosis Date Noted  . Cervical spondylosis 02/07/2019   Lulu Riding PT, DPT, LAT, ATC  07/07/20  5:32 PM      [facility] [address] [place], [place], [address] Phone: [phone]   Fax:  [phone]  Name: KOBEN DAMAN MRN: [id number] Date of Birth: Nov 21, 1956

## 2020-07-23 ENCOUNTER — Other Ambulatory Visit: Payer: Self-pay

## 2020-07-23 ENCOUNTER — Ambulatory Visit: Payer: Medicare Other | Admitting: Physical Therapy

## 2020-07-23 ENCOUNTER — Encounter: Payer: Self-pay | Admitting: Physical Therapy

## 2020-07-23 DIAGNOSIS — M6281 Muscle weakness (generalized): Secondary | ICD-10-CM

## 2020-07-23 DIAGNOSIS — M542 Cervicalgia: Secondary | ICD-10-CM

## 2020-07-23 DIAGNOSIS — M545 Low back pain, unspecified: Secondary | ICD-10-CM

## 2020-07-23 DIAGNOSIS — G8929 Other chronic pain: Secondary | ICD-10-CM

## 2020-07-23 NOTE — Therapy (Signed)
[facility] [address] [place], [place], [address] Phone: [phone]   Fax:  [phone]  Physical Therapy Treatment  Patient Details  Name: Seth Adams MRN: [id number] Date of Birth: [birth year] Referring Provider (PT): York Spaniel, MD    Encounter Date: 07/23/2020   PT End of Session - 07/23/20 1148    Visit Number 2    Number of Visits 17    Date for PT Re-Evaluation 09/01/20    Authorization Type MCR: Kx mod at 15h visit    Progress Note Due on Visit 10    PT Start Time 1148    PT Stop Time 1228    PT Time Calculation (min) 40 min    Activity Tolerance Patient tolerated treatment well    Behavior During Therapy [facility] for tasks assessed/performed           Past Medical History:  Diagnosis Date  . Cervical spondylosis 02/07/2019    Past Surgical History:  Procedure Laterality Date  . BACK SURGERY     Lower back surgery x2  . CATARACT EXTRACTION W/ INTRAOCULAR LENS  IMPLANT, BILATERAL    . eyelid lift  1   2019 [facility]  . FOOT SURGERY      There were no vitals filed for this visit.   Subjective Assessment - 07/23/20 1149    Subjective "I am doing okay since the last session. I've been doing alot of work so its tough to tell."    Patient Stated Goals to decrease the pain, improve motion of the leg    Currently in Pain? Yes    Pain Score 4     Pain Location Neck    Pain Orientation Right    Pain Descriptors / Indicators Aching    Pain Type Chronic pain              [facility] PT Assessment - 07/23/20 0001      Assessment   Medical Diagnosis Cervical spondylosis     Referring Provider (PT) York Spaniel, MD       Strength   Right Shoulder Flexion 3+/5    Right Shoulder ABduction 3+/5    Right Shoulder Internal Rotation 4+/5    Right Shoulder External Rotation 4+/5    Left Shoulder Flexion 3+/5    Left Shoulder ABduction 3+/5    Left Shoulder Internal Rotation 4+/5    Left  Shoulder External Rotation 4+/5    Right Hip Flexion 4+/5   reproducted L low back pain   Right Hip ABduction 4/5    Left Hip Flexion 4+/5    Left Hip ABduction 4/5      Special Tests    Special Tests Leg LengthTest    Leg length test  True;Apparent      True   Right 87 in.    Left  89.2 in.      Apparent   Right 99.5 in.    Left 100 in.                         [facility] Adult PT Treatment/Exercise - 07/23/20 0001      Self-Care   Self-Care Other Self-Care Comments    Other Self-Care Comments  provided heel lift in the R shoe due to LLD with LLE being longer      Lumbar Exercises: Standing   Other Standing Lumbar Exercises stair training with heel lift in R shoe up/  down 6 inch step   pt reported no pain     Manual Therapy   Manual Therapy Other (comment);Joint mobilization    Manual therapy comments skilled palpation and monitoring of pt throughout TPDN    Joint Mobilization T1-T7 PA grade III, L first rib mobs grade III    Soft tissue mobilization IASTM along L upper trap/ levator scapulae    Other Manual Therapy A/C taping to promote stability                  PT Education - 07/23/20 1231    Education Details assessment of the LLD And findings,  benefits of heel lift regarding LLD. anatomy regarding A/C joint and common MOI. FOTO information    Person(s) Educated Patient    Methods Explanation;Verbal cues    Comprehension Verbalized understanding;Verbal cues required            PT Short Term Goals - 07/07/20 1722      PT SHORT TERM GOAL #1   Title Independent with initial HEP    Time 4    Period Weeks    Status New    Target Date 08/04/20      PT SHORT TERM GOAL #2   Title to assess strength for bil UE/ LE and make goals accordingly    Time 1    Period Weeks    Status New    Target Date 07/14/20             PT Long Term Goals - 07/07/20 1723      PT LONG TERM GOAL #1   Title increase cervical R Sidebending/ flexion  to [facility]  with </= 2/10 pain for functional ROM required for ADLs    Time 8    Period Weeks    Status New    Target Date 09/01/20      PT LONG TERM GOAL #2   Title reduce cervical muscle spasm to redcue pain to </= 1/10 for QOL    Time 8    Period Weeks    Status New    Target Date 09/01/20      PT LONG TERM GOAL #3   Title increase FOTO score to </= 38% limited to demo improvement in function    Time 8    Period Weeks    Status New    Target Date 09/01/20      PT LONG TERM GOAL #4   Title improve trunk mobility in all planes to [facility] with </= 2/10 pain for function    Time 8    Period Weeks    Status New    Target Date 09/01/20      PT LONG TERM GOAL #5   Title pt to be I with all HEP given to maintain and progress current level of functin IND    Time 8    Period Weeks    Status New    Target Date 09/01/20                 Plan - 07/23/20 1233    Clinical Impression Statement pt reports continued L shoulder and L low back pain. pt demonstrates true  LLD with LLE being longer. provided heel lift for the R shoe. continued TPDN focusing onthe L upper trap followed with IASTM and distal clavicle stabilization taping. pt reported decreased pain with standing/ walking, and was able to navigate up/down steps without issues.    PT Treatment/Interventions ADLs/Self Care Home  Management;Cryotherapy;Electrical Stimulation;Iontophoresis 4mg /ml Dexamethasone;Moist Heat;Traction;Ultrasound;Stair training;Therapeutic activities;Therapeutic exercise;Neuromuscular re-education;Manual techniques;Patient/family education;Passive range of motion;Dry needling;Taping    PT Next Visit Plan review/ update HEP PRN, hip/ knee strength assessment, look at leg length discrepancy?, continued TPDN for bil upper trap/ thoracic mobiltiy, hows heel lift    PT Home Exercise Plan [id number] - upper trap stretch, levator scapular stretch, book opening, LTR    Consulted and Agree with Plan of Care Patient            Patient will benefit from skilled therapeutic intervention in order to improve the following deficits and impairments:  Improper body mechanics, Decreased strength, Postural dysfunction, Pain, Decreased balance, Decreased activity tolerance, Decreased endurance, Decreased range of motion  Visit Diagnosis: Cervicalgia  Chronic right-sided low back pain, unspecified whether sciatica present  Muscle weakness (generalized)  Chronic bilateral low back pain without sciatica     Problem List Patient Active Problem List   Diagnosis Date Noted  . Cervical spondylosis 02/07/2019   02/09/2019 PT, DPT, LAT, ATC  07/23/20  12:36 PM      [facility] [address] [place], [place], [place] Phone: [phone]   Fax:  [phone]  Name: ANDRIY SHERK MRN: Fayne Mediate Date of Birth: 1956-10-23

## 2020-07-27 ENCOUNTER — Encounter: Payer: Self-pay | Admitting: Physical Therapy

## 2020-07-27 ENCOUNTER — Ambulatory Visit: Payer: Medicare Other | Attending: Neurology | Admitting: Physical Therapy

## 2020-07-27 ENCOUNTER — Other Ambulatory Visit: Payer: Self-pay

## 2020-07-27 DIAGNOSIS — M545 Low back pain, unspecified: Secondary | ICD-10-CM

## 2020-07-27 DIAGNOSIS — M6281 Muscle weakness (generalized): Secondary | ICD-10-CM | POA: Insufficient documentation

## 2020-07-27 DIAGNOSIS — G8929 Other chronic pain: Secondary | ICD-10-CM | POA: Insufficient documentation

## 2020-07-27 DIAGNOSIS — M542 Cervicalgia: Secondary | ICD-10-CM | POA: Diagnosis present

## 2020-07-27 NOTE — Therapy (Signed)
[facility] Outpatient Rehabilitation [facility] [address] [place], [place], [address] Phone: [phone]   Fax:  [phone]  Physical Therapy Treatment  Patient Details  Name: Seth Adams MRN: [id number] Date of Birth: Nov 11, 1956 Referring Provider (PT): York Spaniel, MD    Encounter Date: 07/27/2020   PT End of Session - 07/27/20 1158    Visit Number 3    Number of Visits 17    Date for PT Re-Evaluation 09/01/20    Authorization Type MCR: Kx mod at 15h visit    Progress Note Due on Visit 10    PT Start Time 1147    PT Stop Time 1230    PT Time Calculation (min) 43 min    Activity Tolerance Patient tolerated treatment well    Behavior During Therapy [facility] for tasks assessed/performed           Past Medical History:  Diagnosis Date  . Cervical spondylosis 02/07/2019    Past Surgical History:  Procedure Laterality Date  . BACK SURGERY     Lower back surgery x2  . CATARACT EXTRACTION W/ INTRAOCULAR LENS  IMPLANT, BILATERAL    . eyelid lift  1   2019 [facility]  . FOOT SURGERY      There were no vitals filed for this visit.   Subjective Assessment - 07/27/20 1150    Subjective "The insert has really helped the taping on the shoulder.    Currently in Pain? Yes    Pain Score 5     Pain Location Neck    Pain Orientation Right    Pain Descriptors / Indicators Aching    Pain Type Chronic pain    Aggravating Factors  working    Pain Relieving Factors getting out of the positioning    Pain Score 0    Pain Location Back    Pain Orientation Right    Pain Onset More than a month ago    Pain Frequency Intermittent    Aggravating Factors  turning to the R              [facility] PT Assessment - 07/27/20 0001      Assessment   Medical Diagnosis Cervical spondylosis     Referring Provider (PT) York Spaniel, MD                          [facility] Adult PT Treatment/Exercise - 07/27/20 0001      Neck Exercises:  Theraband   Horizontal ABduction 15 reps   x 2 sets with yellow band     Neck Exercises: Standing   Other Standing Exercises lower trap strengthening with elbows against the wall with yellow band 2 x 15    Other Standing Exercises scapular retraction against the wall 2 x 10    tactile cues/ demonstration for proper form     Manual Therapy   Manual therapy comments skilled palpation and monitoring of pt throughout TPDN    Joint Mobilization T1-T7 PA grade III, L first rib mobs grade III    Soft tissue mobilization IASTM along L upper trap/ levator scapulae    Other Manual Therapy A/C taping to promote stability      Neck Exercises: Stretches   Upper Trapezius Stretch Left;2 reps;30 seconds    Levator Stretch Left;2 reps;30 seconds            Trigger Point Dry Needling - 07/27/20 0001  Consent Given? Yes    Education Handout Provided Previously provided    Muscles Treated Head and Neck Upper trapezius    Electrical Stimulation Performed with Dry Needling Yes    E-stim with Dry Needling Details frequency 20, x 8 min starting level to tolerance, and increasing as tolerated     Upper Trapezius Response Twitch reponse elicited;Palpable increased muscle length   L                  PT Short Term Goals - 07/07/20 1722      PT SHORT TERM GOAL #1   Title Independent with initial HEP    Time 4    Period Weeks    Status New    Target Date 08/04/20      PT SHORT TERM GOAL #2   Title to assess strength for bil UE/ LE and make goals accordingly    Time 1    Period Weeks    Status New    Target Date 07/14/20             PT Long Term Goals - 07/07/20 1723      PT LONG TERM GOAL #1   Title increase cervical R Sidebending/ flexion  to [facility] with </= 2/10 pain for functional ROM required for ADLs    Time 8    Period Weeks    Status New    Target Date 09/01/20      PT LONG TERM GOAL #2   Title reduce cervical muscle spasm to redcue pain to </= 1/10 for QOL    Time 8      Period Weeks    Status New    Target Date 09/01/20      PT LONG TERM GOAL #3   Title increase FOTO score to </= 38% limited to demo improvement in function    Time 8    Period Weeks    Status New    Target Date 09/01/20      PT LONG TERM GOAL #4   Title improve trunk mobility in all planes to [facility] with </= 2/10 pain for function    Time 8    Period Weeks    Status New    Target Date 09/01/20      PT LONG TERM GOAL #5   Title pt to be I with all HEP given to maintain and progress current level of functin IND    Time 8    Period Weeks    Status New    Target Date 09/01/20                 Plan - 07/27/20 1231    Clinical Impression Statement pt notes significant relief from the heel lift and the distal clavicle taping. continued TPDN focusing on the L upper trap combined with E-stim followed with IASTM techniques and throacic/ rib mobs. continued working on stabilizing the scapulae with focus on retraction.    PT Treatment/Interventions ADLs/Self Care Home Management;Cryotherapy;Electrical Stimulation;Iontophoresis 4mg /ml Dexamethasone;Moist Heat;Traction;Ultrasound;Stair training;Therapeutic activities;Therapeutic exercise;Neuromuscular re-education;Manual techniques;Patient/family education;Passive range of motion;Dry needling;Taping    PT Next Visit Plan review/ update HEP PRN, hip/ knee strength assessment, look at leg length discrepancy?, continued TPDN for bil upper trap/ thoracic mobiltiy, hows heel lift    PT Home Exercise Plan [id number] - upper trap stretch, levator scapular stretch, book opening, LTR    Consulted and Agree with Plan of Care Patient  Patient will benefit from skilled therapeutic intervention in order to improve the following deficits and impairments:  Improper body mechanics, Decreased strength, Postural dysfunction, Pain, Decreased balance, Decreased activity tolerance, Decreased endurance, Decreased range of motion  Visit  Diagnosis: Cervicalgia  Chronic right-sided low back pain, unspecified whether sciatica present  Muscle weakness (generalized)  Chronic bilateral low back pain without sciatica     Problem List Patient Active Problem List   Diagnosis Date Noted  . Cervical spondylosis 02/07/2019   Seth Adams PT, DPT, LAT, ATC  07/27/20  12:34 PM      [facility] [address] [place], [place], [address] Phone: [phone]   Fax:  [phone]  Name: Seth Adams MRN: [id number] Date of Birth: 02-18-1956

## 2020-07-30 ENCOUNTER — Ambulatory Visit: Payer: Medicare Other | Admitting: Physical Therapy

## 2020-08-04 ENCOUNTER — Other Ambulatory Visit: Payer: Self-pay

## 2020-08-04 ENCOUNTER — Ambulatory Visit: Payer: Medicare Other | Admitting: Physical Therapy

## 2020-08-04 ENCOUNTER — Encounter: Payer: Self-pay | Admitting: Physical Therapy

## 2020-08-04 DIAGNOSIS — M6281 Muscle weakness (generalized): Secondary | ICD-10-CM

## 2020-08-04 DIAGNOSIS — M542 Cervicalgia: Secondary | ICD-10-CM | POA: Diagnosis not present

## 2020-08-04 DIAGNOSIS — G8929 Other chronic pain: Secondary | ICD-10-CM

## 2020-08-04 DIAGNOSIS — M545 Low back pain, unspecified: Secondary | ICD-10-CM

## 2020-08-04 NOTE — Therapy (Signed)
[facility] Outpatient Rehabilitation [facility] [address] [place], [place], [address] Phone: [phone]   Fax:  [phone]  Physical Therapy Treatment  Patient Details  Name: Seth Adams MRN: [id number] Date of Birth: Jun 07, 1956 Referring Provider (PT): York Spaniel, MD    Encounter Date: 08/04/2020   PT End of Session - 08/04/20 1152    Visit Number 4    Number of Visits 17    Date for PT Re-Evaluation 09/01/20    Authorization Type MCR: Kx mod at 15h visit    Progress Note Due on Visit 10    PT Start Time 1148    PT Stop Time 1228    PT Time Calculation (min) 40 min    Activity Tolerance Patient tolerated treatment well    Behavior During Therapy [facility] for tasks assessed/performed           Past Medical History:  Diagnosis Date  . Cervical spondylosis 02/07/2019    Past Surgical History:  Procedure Laterality Date  . BACK SURGERY     Lower back surgery x2  . CATARACT EXTRACTION W/ INTRAOCULAR LENS  IMPLANT, BILATERAL    . eyelid lift  1   2019 [facility]  . FOOT SURGERY      There were no vitals filed for this visit.   Subjective Assessment - 08/04/20 1153    Subjective " I am doing alittle better, but I still did alot of painting which still caused some soreness."    Patient Stated Goals to decrease the pain, improve motion of the leg    Currently in Pain? Yes    Pain Score 4     Pain Location Neck    Pain Orientation Right    Pain Descriptors / Indicators Aching    Pain Type Chronic pain    Pain Onset More than a month ago    Pain Frequency Intermittent    Aggravating Factors  working              [facility] PT Assessment - 08/04/20 0001      Assessment   Medical Diagnosis Cervical spondylosis     Referring Provider (PT) York Spaniel, MD                          [facility] Adult PT Treatment/Exercise - 08/04/20 0001      Neck Exercises: Machines for Strengthening   UBE (Upper Arm Bike) L4 x 4 min     FWD/BWD x 2 min ea.     Neck Exercises: Theraband   Horizontal ABduction 15 reps   with red band   Horizontal ABduction Limitations tactile cues for L scapular adduction during exercise    Other Theraband Exercises scapular protraciton bil with green theraband 1 x 20      Manual Therapy   Manual therapy comments skilled palpation and monitoring of pt throughout TPDN    Joint Mobilization T1-T7 PA grade III, L first rib mobs grade III    Soft tissue mobilization IASTM along L upper trap/ levator scapulae    Other Manual Therapy A/C taping to promote stability      Neck Exercises: Stretches   Upper Trapezius Stretch 1 rep;30 seconds    Levator Stretch 1 rep;30 seconds    Chest Stretch 2 reps;30 seconds   against the door           Trigger Point Dry Needling - 08/04/20 0001  Consent Given? Yes    Education Handout Provided Previously provided    Muscles Treated Head and Neck Levator scapulae    Upper Trapezius Response Twitch reponse elicited;Palpable increased muscle length   bil   Levator Scapulae Response Twitch response elicited;Palpable increased muscle length   bil                 PT Short Term Goals - 08/04/20 1245      PT SHORT TERM GOAL #1   Title Independent with initial HEP    Period Weeks    Status Achieved      PT SHORT TERM GOAL #2   Title to assess strength for bil UE/ LE and make goals accordingly    Period Weeks    Status Achieved             PT Long Term Goals - 07/07/20 1723      PT LONG TERM GOAL #1   Title increase cervical R Sidebending/ flexion  to [facility] with </= 2/10 pain for functional ROM required for ADLs    Time 8    Period Weeks    Status New    Target Date 09/01/20      PT LONG TERM GOAL #2   Title reduce cervical muscle spasm to redcue pain to </= 1/10 for QOL    Time 8    Period Weeks    Status New    Target Date 09/01/20      PT LONG TERM GOAL #3   Title increase FOTO score to </= 38% limited to demo improvement  in function    Time 8    Period Weeks    Status New    Target Date 09/01/20      PT LONG TERM GOAL #4   Title improve trunk mobility in all planes to [facility] with </= 2/10 pain for function    Time 8    Period Weeks    Status New    Target Date 09/01/20      PT LONG TERM GOAL #5   Title pt to be I with all HEP given to maintain and progress current level of functin IND    Time 8    Period Weeks    Status New    Target Date 09/01/20                 Plan - 08/04/20 1244    Clinical Impression Statement pt reports that he feels some soreness but is making progress in the shoulder and the back. continued TPDN focusing on bil upper trap/ levator scapuale followed with IASTM techniques and mobs. conitnued working on posterior scap stabilizers and pec stretching. end of session he reported decreased pain/ stiffness end of session.    PT Treatment/Interventions ADLs/Self Care Home Management;Cryotherapy;Electrical Stimulation;Iontophoresis 4mg /ml Dexamethasone;Moist Heat;Traction;Ultrasound;Stair training;Therapeutic activities;Therapeutic exercise;Neuromuscular re-education;Manual techniques;Patient/family education;Passive range of motion;Dry needling;Taping    PT Next Visit Plan review/ update HEP PRN, hip/ knee strength assessment, look at leg length discrepancy?, continued TPDN for bil upper trap/ thoracic mobiltiy, hows heel lift    PT Home Exercise Plan [id number] - upper trap stretch, levator scapular stretch, book opening, LTR    Consulted and Agree with Plan of Care Patient           Patient will benefit from skilled therapeutic intervention in order to improve the following deficits and impairments:  Improper body mechanics, Decreased strength, Postural dysfunction, Pain, Decreased balance, Decreased activity tolerance, Decreased endurance,  Decreased range of motion  Visit Diagnosis: Cervicalgia  Chronic right-sided low back pain, unspecified whether sciatica  present  Muscle weakness (generalized)  Chronic bilateral low back pain without sciatica     Problem List Patient Active Problem List   Diagnosis Date Noted  . Cervical spondylosis 02/07/2019   Lulu Riding PT, DPT, LAT, ATC  08/04/20  12:46 PM      [facility] Out[facility] [address] [place], [place], [address] Phone: [phone]   Fax:  [phone]  Name: Seth Adams MRN: [id number] Date of Birth: 01-May-1956

## 2020-08-06 ENCOUNTER — Ambulatory Visit: Payer: Medicare Other | Admitting: Physical Therapy

## 2020-08-06 ENCOUNTER — Encounter: Payer: Self-pay | Admitting: Physical Therapy

## 2020-08-06 ENCOUNTER — Other Ambulatory Visit: Payer: Self-pay

## 2020-08-06 DIAGNOSIS — G8929 Other chronic pain: Secondary | ICD-10-CM

## 2020-08-06 DIAGNOSIS — M545 Low back pain, unspecified: Secondary | ICD-10-CM

## 2020-08-06 DIAGNOSIS — M542 Cervicalgia: Secondary | ICD-10-CM | POA: Diagnosis not present

## 2020-08-06 DIAGNOSIS — M6281 Muscle weakness (generalized): Secondary | ICD-10-CM

## 2020-08-06 NOTE — Therapy (Signed)
[facility] [address] [place], [place], [address] Phone: [phone]   Fax:  [phone]  Physical Therapy Treatment  Patient Details  Name: Seth Adams MRN: [id number] Date of Birth: Feb 23, 1956 Referring Provider (PT): Seth Spaniel, MD    Encounter Date: 08/06/2020   PT End of Session - 08/06/20 1016    Visit Number 5    Number of Visits 17    Date for PT Re-Evaluation 09/01/20    Authorization Type MCR: Kx mod at 15h visit    Progress Note Due on Visit 10    PT Start Time 1016    PT Stop Time 1058    PT Time Calculation (min) 42 min    Activity Tolerance Patient tolerated treatment well    Behavior During Therapy [facility] for tasks assessed/performed           Past Medical History:  Diagnosis Date  . Cervical spondylosis 02/07/2019    Past Surgical History:  Procedure Laterality Date  . BACK SURGERY     Lower back surgery x2  . CATARACT EXTRACTION W/ INTRAOCULAR LENS  IMPLANT, BILATERAL    . eyelid lift  1   2019 [facility]  . FOOT SURGERY      There were no vitals filed for this visit.   Subjective Assessment - 08/06/20 1016    Subjective "I am doing fine. Still having some pain but it doesn't feel as tense in the L shoulder."    Patient Stated Goals to decrease the pain, improve motion of the leg    Currently in Pain? Yes    Pain Score 3     Pain Location Neck    Pain Orientation Right    Pain Onset More than a month ago              S[facility] PT Assessment - 08/06/20 0001      Assessment   Medical Diagnosis Cervical spondylosis     Referring Provider (PT) Seth Spaniel, MD                          [facility] Adult PT Treatment/Exercise - 08/06/20 0001      Self-Care   Self-Care Other Self-Care Comments    Other Self-Care Comments  self-application of A/C joint taping to promote relief   pt performed actiivty with mod cues on technique     Manual Therapy   Manual  therapy comments skilled palpation and monitoring of pt throughout TPDN    Joint Mobilization T1-T7 PA grade III, L first rib mobs grade III    Soft tissue mobilization IASTM along L upper trap/ levator scapulae    Other Manual Therapy A/C taping to promote stability      Neck Exercises: Stretches   Upper Trapezius Stretch 2 reps;30 seconds;Left   with over pressure   Levator Stretch 2 reps;Left;30 seconds   with over pressure           Trigger Point Dry Needling - 08/06/20 0001    Consent Given? Yes    Education Handout Provided Previously provided    Muscles Treated Head and Neck Levator scapulae;Cervical multifidi    Upper Trapezius Response Twitch reponse elicited;Palpable increased muscle length   bil   Levator Scapulae Response Twitch response elicited;Palpable increased muscle length   bil   Cervical multifidi Response Twitch reponse elicited;Palpable increased muscle length   C5- c7  PT Education - 08/06/20 1058    Education Details reviewed taping and practiced with pt applying tape to himself    Person(s) Educated Patient    Methods Explanation;Verbal cues;Handout    Comprehension Verbalized understanding;Verbal cues required            PT Short Term Goals - 08/04/20 1245      PT SHORT TERM GOAL #1   Title Independent with initial HEP    Period Weeks    Status Achieved      PT SHORT TERM GOAL #2   Title to assess strength for bil UE/ LE and make goals accordingly    Period Weeks    Status Achieved             PT Long Term Goals - 07/07/20 1723      PT LONG TERM GOAL #1   Title increase cervical R Sidebending/ flexion  to [facility] with </= 2/10 pain for functional ROM required for ADLs    Time 8    Period Weeks    Status New    Target Date 09/01/20      PT LONG TERM GOAL #2   Title reduce cervical muscle spasm to redcue pain to </= 1/10 for QOL    Time 8    Period Weeks    Status New    Target Date 09/01/20      PT LONG TERM  GOAL #3   Title increase FOTO score to </= 38% limited to demo improvement in function    Time 8    Period Weeks    Status New    Target Date 09/01/20      PT LONG TERM GOAL #4   Title improve trunk mobility in all planes to [facility] with </= 2/10 pain for function    Time 8    Period Weeks    Status New    Target Date 09/01/20      PT LONG TERM GOAL #5   Title pt to be I with all HEP given to maintain and progress current level of functin IND    Time 8    Period Weeks    Status New    Target Date 09/01/20                 Plan - 08/06/20 1058    Clinical Impression Statement [name] continues to make great progress with physical therapy noting decreased neck/ shoulder pain/ stiffness. continued TPDN for neck and posterior shoulder musculature followed with manual techniques. time spent reviewing stretching with over pressure and self application of AC joint taping.    PT Treatment/Interventions ADLs/Self Care Home Management;Cryotherapy;Electrical Stimulation;Iontophoresis 4mg /ml Dexamethasone;Moist Heat;Traction;Ultrasound;Stair training;Therapeutic activities;Therapeutic exercise;Neuromuscular re-education;Manual techniques;Patient/family education;Passive range of motion;Dry needling;Taping    PT Next Visit Plan FOTO next session 6th visit . review/ update HEP PRN, hip/ knee strength assessment, look at leg length discrepancy?, continued TPDN for bil upper trap/ thoracic mobiltiy,    PT Home Exercise Plan [id number] - upper trap stretch, levator scapular stretch, book opening, LTR    Consulted and Agree with Plan of Care Patient           Patient will benefit from skilled therapeutic intervention in order to improve the following deficits and impairments:  Improper body mechanics, Decreased strength, Postural dysfunction, Pain, Decreased balance, Decreased activity tolerance, Decreased endurance, Decreased range of motion  Visit Diagnosis: Chronic right-sided low back pain,  unspecified whether sciatica present  Cervicalgia  Muscle weakness (  generalized)  Chronic bilateral low back pain without sciatica     Problem List Patient Active Problem List   Diagnosis Date Noted  . Cervical spondylosis 02/07/2019    Lulu Riding PT, DPT, LAT, ATC  08/06/20  11:01 AM      [facility] [address] [place], [place], [address] Phone: [phone]   Fax:  [phone]  Name: Seth Adams MRN: [id number] Date of Birth: 10-06-1956

## 2020-08-11 ENCOUNTER — Other Ambulatory Visit: Payer: Self-pay

## 2020-08-11 ENCOUNTER — Ambulatory Visit: Payer: Medicare Other | Admitting: Physical Therapy

## 2020-08-11 ENCOUNTER — Encounter: Payer: Self-pay | Admitting: Physical Therapy

## 2020-08-11 DIAGNOSIS — M542 Cervicalgia: Secondary | ICD-10-CM

## 2020-08-11 DIAGNOSIS — M545 Low back pain, unspecified: Secondary | ICD-10-CM

## 2020-08-11 DIAGNOSIS — G8929 Other chronic pain: Secondary | ICD-10-CM

## 2020-08-11 DIAGNOSIS — M6281 Muscle weakness (generalized): Secondary | ICD-10-CM

## 2020-08-11 NOTE — Therapy (Signed)
[facility] [address] [place], [place], [address] Phone: [phone]   Fax:  [phone]  Physical Therapy Treatment  Patient Details  Name: Seth Adams MRN: [id number] Date of Birth: Jan 16, 1956 Referring Provider (PT): York Spaniel, MD    Encounter Date: 08/11/2020   PT End of Session - 08/11/20 1147    Visit Number 6    Number of Visits 17    Date for PT Re-Evaluation 09/01/20    Authorization Type MCR: Kx mod at 15h visit    Progress Note Due on Visit 10    PT Start Time 1146    PT Stop Time 1230    PT Time Calculation (min) 44 min    Activity Tolerance Patient tolerated treatment well    Behavior During Therapy [facility] for tasks assessed/performed           Past Medical History:  Diagnosis Date  . Cervical spondylosis 02/07/2019    Past Surgical History:  Procedure Laterality Date  . BACK SURGERY     Lower back surgery x2  . CATARACT EXTRACTION W/ INTRAOCULAR LENS  IMPLANT, BILATERAL    . eyelid lift  1   2019 [facility]  . FOOT SURGERY      There were no vitals filed for this visit.   Subjective Assessment - 08/11/20 1149    Subjective " the L is still giving me soreness today, the R is better."    Currently in Pain? Yes    Pain Location Neck    Pain Orientation Left    Pain Descriptors / Indicators Aching    Pain Type Chronic pain    Pain Onset More than a month ago    Pain Frequency Intermittent              [facility] PT Assessment - 08/11/20 0001      Assessment   Medical Diagnosis Cervical spondylosis     Referring Provider (PT) York Spaniel, MD       Observation/Other Assessments   Focus on Therapeutic Outcomes (FOTO)  44% limited                         [facility] Adult PT Treatment/Exercise - 08/11/20 0001      Neck Exercises: Standing   Other Standing Exercises scapular retraction with ER with blue theraband 1 x 12      Manual Therapy   Joint  Mobilization T1-T7 PA grade III, L first rib mobs grade III    Soft tissue mobilization IASTM along L upper trap/ levator scapulae    Other Manual Therapy A/C taping to promote stability on L      Neck Exercises: Stretches   Levator Stretch Left;1 rep;30 seconds    Chest Stretch 3 reps;30 seconds;Other (comment)   PNF contract relax in supine with hands behind head           Trigger Point Dry Needling - 08/11/20 0001    Consent Given? Yes    Education Handout Provided Previously provided    Muscles Treated Upper Quadrant Pectoralis minor;Infraspinatus;Rhomboids    Upper Trapezius Response Twitch reponse elicited;Palpable increased muscle length   L only   Levator Scapulae Response Twitch response elicited;Palpable increased muscle length   L only   Pectoralis Minor Response Twitch response elicited;Palpable increased muscle length   L only   Rhomboids Response Twitch response elicited;Palpable increased muscle length   L only  Infraspinatus Response Twitch response elicited;Palpable increased muscle length   L only   Erector spinae Response Twitch response elicited;Palpable increased muscle length   L only               PT Education - 08/11/20 1232    Education Details Reviewed HEP and discussed improtance as it relates to specifici anatomical structures. utilzied skeleton to eduation regarding scapular function. reviewed FOTO reassessment today.    Person(s) Educated Patient    Methods Explanation;Verbal cues;Handout    Comprehension Verbalized understanding;Verbal cues required            PT Short Term Goals - 08/04/20 1245      PT SHORT TERM GOAL #1   Title Independent with initial HEP    Period Weeks    Status Achieved      PT SHORT TERM GOAL #2   Title to assess strength for bil UE/ LE and make goals accordingly    Period Weeks    Status Achieved             PT Long Term Goals - 07/07/20 1723      PT LONG TERM GOAL #1   Title increase cervical R  Sidebending/ flexion  to [facility] with </= 2/10 pain for functional ROM required for ADLs    Time 8    Period Weeks    Status New    Target Date 09/01/20      PT LONG TERM GOAL #2   Title reduce cervical muscle spasm to redcue pain to </= 1/10 for QOL    Time 8    Period Weeks    Status New    Target Date 09/01/20      PT LONG TERM GOAL #3   Title increase FOTO score to </= 38% limited to demo improvement in function    Time 8    Period Weeks    Status New    Target Date 09/01/20      PT LONG TERM GOAL #4   Title improve trunk mobility in all planes to [facility] with </= 2/10 pain for function    Time 8    Period Weeks    Status New    Target Date 09/01/20      PT LONG TERM GOAL #5   Title pt to be I with all HEP given to maintain and progress current level of functin IND    Time 8    Period Weeks    Status New    Target Date 09/01/20                 Plan - 08/11/20 1236    Clinical Impression Statement pt is making progress with FOTO increasing to 44% limited . Continued TPDN focusing on the L posterior shoulder musculature as well as pec minor secondary to scapular elevation and anterior tilt. continued working on stretching, and time spent educating anatomy and benefits of treatment. end of session he noted decreased pain and improvement in posture.    PT Treatment/Interventions ADLs/Self Care Home Management;Cryotherapy;Electrical Stimulation;Iontophoresis 4mg /ml Dexamethasone;Moist Heat;Traction;Ultrasound;Stair training;Therapeutic activities;Therapeutic exercise;Neuromuscular re-education;Manual techniques;Patient/family education;Passive range of motion;Dry needling;Taping    PT Next Visit Plan FOTO next session 12th visit . review/ update HEP, scapuarl depression, lower trap, serratus strengthening, DN PRN for levator, pec minor/ upper trap. hip strengthening abductor/ extensors.    PT Home Exercise Plan [id number] - upper trap stretch, levator scapular stretch, book  opening, LTR    Consulted and Agree  with Plan of Care Patient           Patient will benefit from skilled therapeutic intervention in order to improve the following deficits and impairments:  Improper body mechanics, Decreased strength, Postural dysfunction, Pain, Decreased balance, Decreased activity tolerance, Decreased endurance, Decreased range of motion  Visit Diagnosis: Chronic right-sided low back pain, unspecified whether sciatica present  Cervicalgia  Muscle weakness (generalized)  Chronic bilateral low back pain without sciatica     Problem List Patient Active Problem List   Diagnosis Date Noted  . Cervical spondylosis 02/07/2019    Lulu Riding PT, DPT, LAT, ATC  08/11/20  12:40 PM      [facility] [address] [place], [place], [address] Phone: [phone]   Fax:  [phone]  Name: RENNIE ROUCH MRN: [id number] Date of Birth: 1956/08/20

## 2020-08-13 ENCOUNTER — Encounter: Payer: Self-pay | Admitting: Physical Therapy

## 2020-08-13 ENCOUNTER — Other Ambulatory Visit: Payer: Self-pay

## 2020-08-13 ENCOUNTER — Ambulatory Visit: Payer: Medicare Other | Admitting: Physical Therapy

## 2020-08-13 DIAGNOSIS — G8929 Other chronic pain: Secondary | ICD-10-CM

## 2020-08-13 DIAGNOSIS — M6281 Muscle weakness (generalized): Secondary | ICD-10-CM

## 2020-08-13 DIAGNOSIS — M545 Low back pain, unspecified: Secondary | ICD-10-CM

## 2020-08-13 DIAGNOSIS — M542 Cervicalgia: Secondary | ICD-10-CM | POA: Diagnosis not present

## 2020-08-13 NOTE — Therapy (Signed)
[facility] [address] [place], [place], [address] Phone: [phone]   Fax:  [phone]  Physical Therapy Treatment  Patient Details  Name: Seth Adams MRN: [id number] Date of Birth: [birth year] Referring Provider (PT): York Spaniel, MD    Encounter Date: 08/13/2020   PT End of Session - 08/13/20 1145    Visit Number 7    Number of Visits 17    Date for PT Re-Evaluation 09/01/20    Authorization Type MCR: Kx mod at 15h visit    Progress Note Due on Visit 10    PT Start Time 1145    PT Stop Time 1225    PT Time Calculation (min) 40 min    Activity Tolerance Patient tolerated treatment well    Behavior During Therapy [facility] for tasks assessed/performed           Past Medical History:  Diagnosis Date  . Cervical spondylosis 02/07/2019    Past Surgical History:  Procedure Laterality Date  . BACK SURGERY     Lower back surgery x2  . CATARACT EXTRACTION W/ INTRAOCULAR LENS  IMPLANT, BILATERAL    . eyelid lift  1   2019 [facility]  . FOOT SURGERY      There were no vitals filed for this visit.   Subjective Assessment - 08/13/20 1146    Subjective "I loved it after the last session, I felt great. 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 Yes    Pain Score 3     Pain Location Neck    Pain Orientation Left    Pain Descriptors / Indicators Aching    Pain Type Chronic pain    Pain Onset More than a month ago              [facility] PT Assessment - 08/13/20 0001      Assessment   Medical Diagnosis Cervical spondylosis     Referring Provider (PT) York Spaniel, MD                          [facility] Adult PT Treatment/Exercise - 08/13/20 0001      Shoulder Exercises: Standing   Internal Rotation Strengthening;15 reps;Theraband      Theraband Level (Shoulder Internal Rotation) Level 4 (Blue)    Internal Rotation Limitations emphasis on scapular retraction maintained throughout exercise for setting     Retraction Strengthening;20 reps;Theraband    Theraband Level (Shoulder Retraction) Level 4 (Blue)      Manual Therapy   Manual therapy comments skilled palpation and monitoring of pt throughout TPDN    Joint Mobilization T1-T7 PA grade III -IV    Soft tissue mobilization IASTM along L upper trap/ levator scapulae    Other Manual Therapy A/C taping to promote stability on L      Neck Exercises: Stretches   Upper Trapezius Stretch 2 reps;30 seconds;Left   with over pressure, PNF   Levator Stretch 2 reps;30 seconds    Chest Stretch 30 seconds;3 reps   with rolled up towel between shoulder blades, PNF           Trigger Point Dry Needling - 08/13/20 0001    Consent Given? Yes    Education Handout Provided  Previously provided    Levator Scapulae Response Twitch response elicited;Palpable increased muscle length   L   Pectoralis Minor Response Twitch response elicited;Palpable increased muscle length    Rhomboids Response Twitch response elicited;Palpable increased muscle length   L   Infraspinatus Response Twitch response elicited;Palpable increased muscle length                  PT Short Term Goals - 08/04/20 1245      PT SHORT TERM GOAL #1   Title Independent with initial HEP    Period Weeks    Status Achieved      PT SHORT TERM GOAL #2   Title to assess strength for bil UE/ LE and make goals accordingly    Period Weeks    Status Achieved             PT Long Term Goals - 07/07/20 1723      PT LONG TERM GOAL #1   Title increase cervical R Sidebending/ flexion  to [facility] with </= 2/10 pain for functional ROM required for ADLs    Time 8    Period Weeks    Status New    Target Date 09/01/20      PT LONG TERM GOAL #2   Title reduce cervical muscle spasm to redcue pain to </= 1/10 for QOL     Time 8    Period Weeks    Status New    Target Date 09/01/20      PT LONG TERM GOAL #3   Title increase FOTO score to </= 38% limited to demo improvement in function    Time 8    Period Weeks    Status New    Target Date 09/01/20      PT LONG TERM GOAL #4   Title improve trunk mobility in all planes to [facility] with </= 2/10 pain for function    Time 8    Period Weeks    Status New    Target Date 09/01/20      PT LONG TERM GOAL #5   Title pt to be I with all HEP given to maintain and progress current level of functin IND    Time 8    Period Weeks    Status New    Target Date 09/01/20                 Plan - 08/13/20 1227    Clinical Impression Statement pt noted significant relief of pain following last session. continued TPDN of L posterior shoulder musculature and pec minor followed with PNF stretching. Continued working on scapualr setting maintainaing position throughout shoulder IR. pt noted significant relief of tension following session.    PT Treatment/Interventions ADLs/Self Care Home Management;Cryotherapy;Electrical Stimulation;Iontophoresis 4mg /ml Dexamethasone;Moist Heat;Traction;Ultrasound;Stair training;Therapeutic activities;Therapeutic exercise;Neuromuscular re-education;Manual techniques;Patient/family education;Passive range of motion;Dry needling;Taping    PT Next Visit Plan FOTO next session 12th visit . review/ update HEP, scapuarl depression, lower trap, serratus strengthening, DN PRN for levator, pec minor/ upper trap. hip strengthening abductor/ extensors.    PT Home Exercise Plan [id number] - upper trap stretch, levator scapular stretch, book opening, LTR    Consulted and Agree with Plan of Care Patient           Patient will benefit from skilled therapeutic intervention in order to improve the following deficits and impairments:     Visit Diagnosis: Chronic right-sided low back pain, unspecified whether sciatica present  Cervicalgia  Muscle  weakness (  generalized)  Chronic bilateral low back pain without sciatica     Problem List Patient Active Problem List   Diagnosis Date Noted  . Cervical spondylosis 02/07/2019   Lulu Riding PT, DPT, LAT, ATC  08/13/20  12:30 PM      [facility] [address] [place], [place], [address] Phone: [phone]   Fax:  [phone]  Name: CHIRON CAMPIONE MRN: [id number] Date of Birth: 12-Oct-1956

## 2020-08-27 ENCOUNTER — Ambulatory Visit: Payer: Medicare Other | Admitting: Physical Therapy

## 2020-09-02 ENCOUNTER — Other Ambulatory Visit: Payer: Self-pay

## 2020-09-02 ENCOUNTER — Ambulatory Visit: Payer: Medicare Other | Attending: Neurology | Admitting: Physical Therapy

## 2020-09-02 DIAGNOSIS — M6281 Muscle weakness (generalized): Secondary | ICD-10-CM | POA: Diagnosis present

## 2020-09-02 DIAGNOSIS — M542 Cervicalgia: Secondary | ICD-10-CM | POA: Diagnosis present

## 2020-09-02 DIAGNOSIS — M545 Low back pain, unspecified: Secondary | ICD-10-CM

## 2020-09-02 DIAGNOSIS — G8929 Other chronic pain: Secondary | ICD-10-CM

## 2020-09-02 NOTE — Therapy (Signed)
[place], [place], [address] Phone: [phone]   Fax:  [phone]  Physical Therapy Treatment / Re-certification  Patient Details  Name: Seth Adams MRN: [id number] Date of Birth: December 04, 1956 Referring Provider (PT): Kathrynn Ducking, MD    Encounter Date: 09/02/2020   PT End of Session - 09/02/20 1056    Visit Number 8    Number of Visits 17    Date for PT Re-Evaluation 09/30/20    Authorization Type MCR: Kx mod at 15h visit    PT Start Time 1058    PT Stop Time 1140    PT Time Calculation (min) 42 min    Activity Tolerance Patient tolerated treatment well    Behavior During Therapy [facility] for tasks assessed/performed           Past Medical History:  Diagnosis Date  . Cervical spondylosis 02/07/2019    Past Surgical History:  Procedure Laterality Date  . BACK SURGERY     Lower back surgery x2  . CATARACT EXTRACTION W/ INTRAOCULAR LENS  IMPLANT, BILATERAL    . eyelid lift  1   2019 [place]  . FOOT SURGERY      There were no vitals filed for this visit.   Subjective Assessment - 09/02/20 1059    Subjective "I still do the exercises at home, but as soon as I go to work I get sore and pain again."    Patient Stated Goals to decrease the pain, improve motion of the leg    Currently in Pain? Yes    Pain Score 2     Pain Orientation Right    Pain Descriptors / Indicators Aching    Pain Type Chronic pain    Pain Onset More than a month ago    Pain Frequency Intermittent    Aggravating Factors  working    Pain Relieving Factors getting out of positioning.              [facility] PT Assessment - 09/02/20 0001      Assessment   Medical Diagnosis Cervical spondylosis     Referring Provider (PT) Kathrynn Ducking, MD       AROM   Cervical Flexion 60    Cervical Extension 32    Cervical - Right Side Bend 38    Cervical - Left Side Bend 38    Cervical - Right Rotation 67     Cervical - Left Rotation 64    Lumbar Flexion 80    Lumbar Extension 23    Lumbar - Right Side Bend 25    Lumbar - Left Side Bend 25                         [facility] Adult PT Treatment/Exercise - 09/02/20 0001      Shoulder Exercises: Seated   Other Seated Exercises lower trap strengthening with elbows proppped on table 2 x 20       Manual Therapy   Manual Therapy Scapular mobilization    Manual therapy comments skilled palpation and monitoring of pt throughout TPDN    Joint Mobilization T1-T7 PA grade III -IV, L first rib inferior mob grade IV    Soft tissue mobilization IASTM for the levator scapulae    Scapular Mobilization inferior scapular mobs grade IV    Other Manual Therapy A/C taping       Neck Exercises: Stretches  Upper Trapezius Stretch 2 reps;30 seconds;Left    Levator Stretch 2 reps;30 seconds            Trigger Point Dry Needling - 09/02/20 0001    Consent Given? Yes    Education Handout Provided Previously provided    Upper Trapezius Response Twitch reponse elicited;Palpable increased muscle length   L only   Levator Scapulae Response Twitch response elicited;Palpable increased muscle length   L only               PT Education - 09/02/20 1225    Education Details reviewed HEP and discussed focus of stretching multipole times a day, and reivewed lower trap strengthening with elbows propped on table.    Person(s) Educated Patient    Methods Explanation;Verbal cues    Comprehension Verbalized understanding;Verbal cues required            PT Short Term Goals - 08/04/20 1245      PT SHORT TERM GOAL #1   Title Independent with initial HEP    Period Weeks    Status Achieved      PT SHORT TERM GOAL #2   Title to assess strength for bil UE/ LE and make goals accordingly    Period Weeks    Status Achieved             PT Long Term Goals - 09/02/20 1057      PT LONG TERM GOAL #1   Title increase cervical R Sidebending/ flexion   to [facility] with </= 2/10 pain for functional ROM required for ADLs    Baseline 7/10 with R sidebending but WFL    Period Weeks    Status Partially Met    Target Date 09/30/20      PT LONG TERM GOAL #2   Title reduce cervical muscle spasm to redcue pain to </= 1/10 for QOL    Baseline 7-8/10    Period Weeks    Status Partially Met    Target Date 09/30/20      PT LONG TERM GOAL #3   Title increase FOTO score to </= 38% limited to demo improvement in function    Time 0    Period Weeks    Status Unable to assess    Target Date 09/30/20      PT LONG TERM GOAL #4   Title improve trunk mobility in all planes to [facility] with </= 2/10 pain for function    Period Weeks    Target Date 09/30/20      PT LONG TERM GOAL #5   Title pt to be I with all HEP given to maintain and progress current level of functin IND    Period Weeks    Status On-going    Target Date 09/30/20                 Plan - 09/02/20 1226    Clinical Impression Statement Seth Adams is making good progrss with physical therapy increasing cervical and trunk mobility. He continues to report intermittent pain/ stiffnes sin the L shoulder which is exacerbated by work related activities. He continues to demo increased tension in the levator scapulae and anterior tipping of the L scapular compared bil. continued TPDN focusing on the L levator scapulae and upper trap followed with IASTM techniques and thoracic/ rib mobs. Continued working lower trap to promote scapular stability and scapulohumeral rhythm. He would benefit from continued physical therapy to increase scapular stability and gross shoulder strength  and maximize overall function by addressing the deficits listed.    PT Frequency 2x / week    PT Duration 3 weeks    PT Treatment/Interventions ADLs/Self Care Home Management;Cryotherapy;Electrical Stimulation;Iontophoresis 17m/ml Dexamethasone;Moist Heat;Traction;Ultrasound;Stair training;Therapeutic activities;Therapeutic  exercise;Neuromuscular re-education;Manual techniques;Patient/family education;Passive range of motion;Dry needling;Taping    PT Next Visit Plan FOTO next session 12th visit . review/ update HEP, scapuarl depression, lower trap, serratus strengthening, DN PRN for levator, pec minor/ upper trap. hip strengthening abductor/ extensors.    PT Home Exercise Plan [id number] - upper trap stretch, levator scapular stretch, book opening, LTR    Consulted and Agree with Plan of Care Patient           Patient will benefit from skilled therapeutic intervention in order to improve the following deficits and impairments:  Improper body mechanics, Decreased strength, Postural dysfunction, Pain, Decreased balance, Decreased activity tolerance, Decreased endurance, Decreased range of motion  Visit Diagnosis: Chronic right-sided low back pain, unspecified whether sciatica present - Plan: PT plan of care cert/re-cert  Cervicalgia - Plan: PT plan of care cert/re-cert  Muscle weakness (generalized) - Plan: PT plan of care cert/re-cert  Chronic bilateral low back pain without sciatica - Plan: PT plan of care cert/re-cert     Problem List Patient Active Problem List   Diagnosis Date Noted  . Cervical spondylosis 02/07/2019    KStarr LakePT, DPT, LAT, ATC  09/02/20  12:34 PM      [facility] LLC[address]GMalone NAl[place] [address]Phone: [phone]  Fax:  [phone] Name: Seth MATTONMRN: [id number] of Birth: [birth year]

## 2020-09-16 ENCOUNTER — Ambulatory Visit: Payer: Medicare Other | Admitting: Physical Therapy

## 2020-09-16 ENCOUNTER — Other Ambulatory Visit: Payer: Self-pay

## 2020-09-16 ENCOUNTER — Encounter: Payer: Self-pay | Admitting: Physical Therapy

## 2020-09-16 DIAGNOSIS — G8929 Other chronic pain: Secondary | ICD-10-CM

## 2020-09-16 DIAGNOSIS — M545 Low back pain, unspecified: Secondary | ICD-10-CM

## 2020-09-16 DIAGNOSIS — M6281 Muscle weakness (generalized): Secondary | ICD-10-CM

## 2020-09-16 DIAGNOSIS — M542 Cervicalgia: Secondary | ICD-10-CM

## 2020-09-16 NOTE — Therapy (Signed)
[place], [place], [address] Phone: [phone]   Fax:  [phone]  Physical Therapy Treatment  Patient Details  Name: Seth Adams MRN: [id number] Date of Birth: 1956-06-10 Referring Provider (PT): Kathrynn Ducking, MD    Encounter Date: 09/16/2020   PT End of Session - 09/16/20 1153    Visit Number 9    Number of Visits 17    Date for PT Re-Evaluation 09/30/20    Authorization Type MCR: Kx mod at 15h visit    Progress Note Due on Visit 10    PT Start Time 0034    PT Stop Time 1230    PT Time Calculation (min) 45 min    Activity Tolerance Patient tolerated treatment well    Behavior During Therapy [facility] for tasks assessed/performed           Past Medical History:  Diagnosis Date  . Cervical spondylosis 02/07/2019    Past Surgical History:  Procedure Laterality Date  . BACK SURGERY     Lower back surgery x2  . CATARACT EXTRACTION W/ INTRAOCULAR LENS  IMPLANT, BILATERAL    . eyelid lift  1   2019 [place]  . FOOT SURGERY      There were no vitals filed for this visit.   Subjective Assessment - 09/16/20 1151    Subjective " I was afraid to make it worse since the last time, but I feel like I am doing better.    Currently in Pain? Yes    Pain Score 2     Pain Location Neck    Pain Orientation Right    Pain Descriptors / Indicators Tightness    Pain Type Chronic pain    Pain Onset More than a month ago    Pain Frequency Intermittent    Aggravating Factors  work              [facility] PT Assessment - 09/16/20 0001      Assessment   Medical Diagnosis Cervical spondylosis     Referring Provider (PT) Kathrynn Ducking, MD                          [facility] Adult PT Treatment/Exercise - 09/16/20 0001      Shoulder Exercises: Supine   Protraction Strengthening   sustained protraction with rhythmic stabilization.      Shoulder Exercises: Sidelying   External  Rotation Strengthening;Left;15 reps;Weights    External Rotation Weight (lbs) 4    Internal Rotation Strengthening;Left;15 reps   with manual resistance   Other Sidelying Exercises rhythmic stablization IR/ER 4 x 30 secon      Manual Therapy   Manual therapy comments skilled palpation and monitoring of pt throughout TPDN    Joint Mobilization T1-T7 PA grade III -IV, L first rib inferior mob grade IV    Other Manual Therapy upper trap inhibition taping            Trigger Point Dry Needling - 09/16/20 0001    Consent Given? Yes    Education Handout Provided Previously provided    Muscles Treated Head and Neck Masseter    Levator Scapulae Response Twitch response elicited;Palpable increased muscle length    Masseter Response Twitch reponse elicited;Palpable increased muscle length   R                 PT Short Term Goals - 08/04/20 1245  PT SHORT TERM GOAL #1   Title Independent with initial HEP    Period Weeks    Status Achieved      PT SHORT TERM GOAL #2   Title to assess strength for bil UE/ LE and make goals accordingly    Period Weeks    Status Achieved             PT Long Term Goals - 09/02/20 1057      PT LONG TERM GOAL #1   Title increase cervical R Sidebending/ flexion  to [facility] with </= 2/10 pain for functional ROM required for ADLs    Baseline 7/10 with R sidebending but WFL    Period Weeks    Status Partially Met    Target Date 09/30/20      PT LONG TERM GOAL #2   Title reduce cervical muscle spasm to redcue pain to </= 1/10 for QOL    Baseline 7-8/10    Period Weeks    Status Partially Met    Target Date 09/30/20      PT LONG TERM GOAL #3   Title increase FOTO score to </= 38% limited to demo improvement in function    Time 0    Period Weeks    Status Unable to assess    Target Date 09/30/20      PT LONG TERM GOAL #4   Title improve trunk mobility in all planes to [facility] with </= 2/10 pain for function    Period Weeks    Target Date  09/30/20      PT LONG TERM GOAL #5   Title pt to be I with all HEP given to maintain and progress current level of functin IND    Period Weeks    Status On-going    Target Date 09/30/20                 Plan - 09/16/20 1242    Clinical Impression Statement pt reports singificant improvement since the last session and has continued to aggressively work on his HEP. continued TPDN for levator scapulae and R masseter secondary to increased stiffness/ referred symptoms which he noted significant relief of pain. continued working on scapular stability with rhythmic stabilization in sidelying to promote humeral head centration.    PT Treatment/Interventions ADLs/Self Care Home Management;Cryotherapy;Electrical Stimulation;Iontophoresis 53m/ml Dexamethasone;Moist Heat;Traction;Ultrasound;Stair training;Therapeutic activities;Therapeutic exercise;Neuromuscular re-education;Manual techniques;Patient/family education;Passive range of motion;Dry needling;Taping    PT Next Visit Plan FOTO 12th visit . review/ update HEP, scapuarl depression, lower trap, serratus strengthening, DN PRN for levator, pec minor/ upper trap. hip strengthening abductor/ extensors.    PT Home Exercise Plan [id number] - upper trap stretch, levator scapular stretch, book opening, LTR    Consulted and Agree with Plan of Care Patient           Patient will benefit from skilled therapeutic intervention in order to improve the following deficits and impairments:  Improper body mechanics, Decreased strength, Postural dysfunction, Pain, Decreased balance, Decreased activity tolerance, Decreased endurance, Decreased range of motion  Visit Diagnosis: Chronic right-sided low back pain, unspecified whether sciatica present  Cervicalgia  Muscle weakness (generalized)  Chronic bilateral low back pain without sciatica     Problem List Patient Active Problem List   Diagnosis Date Noted  . Cervical spondylosis 02/07/2019     KStarr LakePT, DPT, LAT, ATC  09/16/20  12:46 PM      [place]  [address] Phone: [phone]   Fax:  [phone]  Name: Seth Adams MRN: [id number] Date of Birth: 1956-04-15

## 2020-09-23 ENCOUNTER — Ambulatory Visit: Payer: Medicare Other | Admitting: Physical Therapy

## 2020-09-23 ENCOUNTER — Other Ambulatory Visit: Payer: Self-pay

## 2020-09-23 DIAGNOSIS — M6281 Muscle weakness (generalized): Secondary | ICD-10-CM

## 2020-09-23 DIAGNOSIS — M545 Low back pain, unspecified: Secondary | ICD-10-CM

## 2020-09-23 DIAGNOSIS — G8929 Other chronic pain: Secondary | ICD-10-CM

## 2020-09-23 DIAGNOSIS — M542 Cervicalgia: Secondary | ICD-10-CM

## 2020-09-23 NOTE — Therapy (Signed)
[place], [place], [address] Phone: [phone]   Fax:  [phone]  Physical Therapy Treatment Progress Note Reporting Period 07/07/2020 to 09/23/2020  See note below for Objective Data and Assessment of Progress/Goals.       Patient Details  Name: Seth Adams MRN: [id number] Date of Birth: Jan 08, 1956 Referring Provider (PT): Kathrynn Ducking, MD    Encounter Date: 09/23/2020   PT End of Session - 09/23/20 1146    Visit Number 10    Number of Visits 17    Date for PT Re-Evaluation 09/30/20    Authorization Type MCR: Kx mod at 15h visit    Progress Note Due on Visit 20    PT Start Time 9702    PT Stop Time 1229    PT Time Calculation (min) 44 min    Activity Tolerance Patient tolerated treatment well    Behavior During Therapy [facility] for tasks assessed/performed           Past Medical History:  Diagnosis Date  . Cervical spondylosis 02/07/2019    Past Surgical History:  Procedure Laterality Date  . BACK SURGERY     Lower back surgery x2  . CATARACT EXTRACTION W/ INTRAOCULAR LENS  IMPLANT, BILATERAL    . eyelid lift  1   2019 [place]  . FOOT SURGERY      There were no vitals filed for this visit.   Subjective Assessment - 09/23/20 1147    Subjective "I feel like I am feeling pretty good , the taping is still feeling pretty good and is helping keep the shoulder down.    Patient Stated Goals to decrease the pain, improve motion of the leg    Currently in Pain? Yes    Pain Score 1     Pain Orientation Right    Pain Onset More than a month ago              [facility] PT Assessment - 09/23/20 0001      Assessment   Medical Diagnosis Cervical spondylosis     Referring Provider (PT) Kathrynn Ducking, MD                          [facility] Adult PT Treatment/Exercise - 09/23/20 0001      Neck Exercises: Machines for Strengthening   UBE (Upper Arm Bike) L4 x 4  min    fwd/bwd x 2 min     Neck Exercises: Supine   Neck Retraction 10 reps;5 secs   starting with chin tuck, then progressing chin tuck retracti   Neck Retraction Limitations x 2 while laying on tennis balls (taped together)      Shoulder Exercises: Supine   Protraction Strengthening;Both;10 reps   while performed 50% horizontal abduction     Shoulder Exercises: Seated   Other Seated Exercises scapular retraction with ER 1 x 15 with green theraband      Shoulder Exercises: Standing   Other Standing Exercises wall wash 2 x 12 CW/ CCW with with sustained protrcation   cues to avoid throacic rotatoin.      Manual Therapy   Joint Mobilization tacek and stretch of psterior cervical paraspainsl with chin tuck exercise    Other Manual Therapy upper trap inhibition taping      Neck Exercises: Stretches   Upper Trapezius Stretch 2 reps;30 seconds;Left    Levator Stretch 2 reps;30 seconds  PT Education - 09/23/20 1152    Education Details Reviewed FOTO reassessment.    Person(s) Educated Patient    Methods Explanation    Comprehension Verbalized understanding            PT Short Term Goals - 08/04/20 1245      PT SHORT TERM GOAL #1   Title Independent with initial HEP    Period Weeks    Status Achieved      PT SHORT TERM GOAL #2   Title to assess strength for bil UE/ LE and make goals accordingly    Period Weeks    Status Achieved             PT Long Term Goals - 09/23/20 1152      PT LONG TERM GOAL #1   Title increase cervical R Sidebending/ flexion  to [facility] with </= 2/10 pain for functional ROM required for ADLs    Period Weeks    Status Partially Met      PT LONG TERM GOAL #2   Title reduce cervical muscle spasm to redcue pain to </= 1/10 for QOL    Period Weeks    Status Partially Met      PT LONG TERM GOAL #3   Title increase FOTO score to </= 38% limited to demo improvement in function    Period Weeks    Status Achieved      PT  LONG TERM GOAL #4   Title improve trunk mobility in all planes to [facility] with </= 2/10 pain for function    Period Weeks    Status Achieved      PT LONG TERM GOAL #5   Title pt to be I with all HEP given to maintain and progress current level of functin IND    Period Weeks    Status On-going                 Plan - 09/23/20 1236    Clinical Impression Statement pt continues to progress nicely reporting decreased pain. Continued work on scapualr stability to help reduce scapular winging which he is demontrating improvement in stability. He improved his FOTO to 37% limited meeting that goal. plan to see pt for one more visit to review HEP address any question and discharged.    PT Treatment/Interventions ADLs/Self Care Home Management;Cryotherapy;Electrical Stimulation;Iontophoresis 50m/ml Dexamethasone;Moist Heat;Traction;Ultrasound;Stair training;Therapeutic activities;Therapeutic exercise;Neuromuscular re-education;Manual techniques;Patient/family education;Passive range of motion;Dry needling;Taping    PT Next Visit Plan FOTO 12th visit . review/ update HEP, scapuarl depression, lower trap, serratus strengthening, DN PRN for levator, pec minor/ upper trap. hip strengthening abductor/ extensors.    PT Home Exercise Plan [id number] - upper trap stretch, levator scapular stretch, book opening, LTR    Consulted and Agree with Plan of Care Patient           Patient will benefit from skilled therapeutic intervention in order to improve the following deficits and impairments:  Improper body mechanics, Decreased strength, Postural dysfunction, Pain, Decreased balance, Decreased activity tolerance, Decreased endurance, Decreased range of motion  Visit Diagnosis: Chronic right-sided low back pain, unspecified whether sciatica present  Cervicalgia  Muscle weakness (generalized)  Chronic bilateral low back pain without sciatica     Problem List Patient Active Problem List   Diagnosis  Date Noted  . Cervical spondylosis 02/07/2019    KStarr LakePT, DPT, LAT, ATC  09/23/20  12:46 PM      [place]  [place], [place], [address] Phone: [phone]   Fax:  [phone]  Name: Seth Adams MRN: [id number] Date of Birth: February 07, 1956

## 2020-09-28 ENCOUNTER — Other Ambulatory Visit: Payer: Self-pay

## 2020-09-28 ENCOUNTER — Encounter (INDEPENDENT_AMBULATORY_CARE_PROVIDER_SITE_OTHER): Payer: Self-pay | Admitting: Otolaryngology

## 2020-09-28 ENCOUNTER — Ambulatory Visit (INDEPENDENT_AMBULATORY_CARE_PROVIDER_SITE_OTHER): Payer: Medicare Other | Admitting: Otolaryngology

## 2020-09-28 VITALS — Temp 97.7°F

## 2020-09-28 DIAGNOSIS — M26609 Unspecified temporomandibular joint disorder, unspecified side: Secondary | ICD-10-CM

## 2020-09-28 NOTE — Progress Notes (Signed)
HPI: Seth Adams is a 64 y.o. male who returns today for evaluation of right ear pain.  Sometimes the pain shoots up from his jaw up past the ear and into the head.  He knows he has TMJ problems but the ear felt deep inside the ear he wanted the ear checked.  He has not noted any hearing problems.  He has had no drainage from the ear..  Past Medical History:  Diagnosis Date  . Cervical spondylosis 02/07/2019   Past Surgical History:  Procedure Laterality Date  . BACK SURGERY     Lower back surgery x2  . CATARACT EXTRACTION W/ INTRAOCULAR LENS  IMPLANT, BILATERAL    . eyelid lift  1   2019 [facility]  . FOOT SURGERY     Social History   Socioeconomic History  . Marital status: Married    Spouse name: Olegario Messier   . Number of children: 1  . Years of education: 65  . Highest education level: Not on file  Occupational History  . Occupation: Self employed  Tobacco Use  . Smoking status: Never Smoker  . Smokeless tobacco: Never Used  Vaping Use  . Vaping Use: Never used  Substance and Sexual Activity  . Alcohol use: Yes    Comment: a beer every once in a while  . Drug use: Not on file  . Sexual activity: Not on file  Other Topics Concern  . Not on file  Social History Narrative   Lives with wife   Caffeine use: 1 5-hour energy drink per day   Right handed   Social Determinants of Health   Financial Resource Strain:   . Difficulty of Paying Living Expenses: Not on file  Food Insecurity:   . Worried About Programme researcher, broadcasting/film/video in the Last Year: Not on file  . Ran Out of Food in the Last Year: Not on file  Transportation Needs:   . Lack of Transportation (Medical): Not on file  . Lack of Transportation (Non-Medical): Not on file  Physical Activity:   . Days of Exercise per Week: Not on file  . Minutes of Exercise per Session: Not on file  Stress:   . Feeling of Stress : Not on file  Social Connections:   . Frequency of Communication with Friends and Family: Not  on file  . Frequency of Social Gatherings with Friends and Family: Not on file  . Attends Religious Services: Not on file  . Active Member of Clubs or Organizations: Not on file  . Attends Banker Meetings: Not on file  . Marital Status: Not on file   No family history on file. Allergies  Allergen Reactions  . Codeine Itching and Nausea Only   Prior to Admission medications   Medication Sig Start Date End Date Taking? Authorizing Provider  allopurinol (ZYLOPRIM) 300 MG tablet Take 300 mg by mouth as needed.   Yes [provider]  CALCIUM PO Take 1 tablet by mouth daily.   Yes [provider]  Cholecalciferol (VITAMIN D PO) Take 1 tablet by mouth daily.   Yes [provider]  CINNAMON PO Take 1 tablet by mouth 2 (two) times daily.   Yes [provider]  colchicine 0.6 MG tablet Take 0.6 mg by mouth as needed.    Yes [provider]  cyclobenzaprine (FLEXERIL) 10 MG tablet Take 1 tablet (10 mg total) by mouth 3 (three) times daily as needed for muscle spasms. 02/07/19  Yes York Spaniel, MD  fluticasone [facility]) 50 MCG/ACT nasal spray Place 2 sprays into both nostrils daily.   Yes [provider]  GARLIC PO Take 1 tablet by mouth 2 (two) times daily.   Yes [provider]  ibuprofen (ADVIL,MOTRIN) 100 MG tablet Take 100 mg by mouth every 6 (six) hours as needed for fever.   Yes [provider]  Multiple Vitamin (MULTIVITAMIN WITH MINERALS) TABS tablet Take 1 tablet by mouth daily.   Yes [provider]  OVER THE COUNTER MEDICATION Take 5 drops by mouth daily. Mixes Frankincense and Thieves in liquid to drink daily   Yes [provider]  predniSONE (DELTASONE) 5 MG tablet Begin taking 6 tablets daily, taper by one tablet daily until off the medication. 02/07/19  Yes York Spaniel, MD  traMADol (ULTRAM) 50 MG tablet Take 0.5 tablets (25 mg total) by mouth every 6 (six) hours as needed  for moderate pain. 08/01/18  Yes Butch Penny, NP     Positive ROS: Otherwise negative  All other systems have been reviewed and were otherwise negative with the exception of those mentioned in the HPI and as above.  Physical Exam: Constitutional: Alert, well-appearing, no acute distress Ears: External ears without lesions or tenderness. Ear canals are clear bilaterally.  TMs are clear bilaterally.  Hearing screening with a tuning forks revealed symmetric hearing with AC > BC bilaterally and Weber midline. Nasal: External nose without lesions.. Clear nasal passages Oral: Lips and gums without lesions. Tongue and palate mucosa without lesions. Posterior oropharynx clear.  Patient status post tonsillectomy with clear oropharynx otherwise.  Indirect laryngoscopy revealed a clear base of tongue vallecula epiglottis and hypopharynx is clear. Neck: No palpable adenopathy or masses.  No pain or swelling on palpation of the neck.  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  Plan: Recommended soft diet and NSAIDs for the acute pain and would recommend follow-up with his dentist or oral surgeon for further treatment of TMJ problems.   Narda Bonds, MD

## 2020-09-30 ENCOUNTER — Ambulatory Visit: Payer: Medicare Other | Admitting: Physical Therapy

## 2020-10-07 ENCOUNTER — Other Ambulatory Visit: Payer: Self-pay

## 2020-10-07 ENCOUNTER — Encounter: Payer: Self-pay | Admitting: Emergency Medicine

## 2020-10-07 ENCOUNTER — Ambulatory Visit
Admission: EM | Admit: 2020-10-07 | Discharge: 2020-10-07 | Disposition: A | Discharge status: Home / Self Care | Payer: Medicare Other | Attending: Emergency Medicine | Admitting: Emergency Medicine

## 2020-10-07 DIAGNOSIS — S161XXA Strain of muscle, fascia and tendon at neck level, initial encounter: Secondary | ICD-10-CM

## 2020-10-07 MED ORDER — NAPROXEN 500 MG PO TABS: 500.0000 mg | ORAL_TABLET | Freq: Two times a day (BID) | ORAL | 0 refills | Status: DC

## 2020-10-07 MED ORDER — TIZANIDINE HCL 2 MG PO TABS
2.0000 mg | ORAL_TABLET | Freq: Four times a day (QID) | ORAL | 0 refills | Status: AC | PRN
Start: 2020-10-07 — End: 2020-10-12
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
## 2020-10-07 NOTE — Discharge Instructions (Signed)
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
## 2020-10-22 ENCOUNTER — Ambulatory Visit: Payer: Medicare Other | Attending: Neurology | Admitting: Physical Therapy

## 2020-10-22 ENCOUNTER — Other Ambulatory Visit: Payer: Self-pay

## 2020-10-22 ENCOUNTER — Encounter: Payer: Self-pay | Admitting: Physical Therapy

## 2020-10-22 DIAGNOSIS — M47812 Spondylosis without myelopathy or radiculopathy, cervical region: Secondary | ICD-10-CM
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
## 2020-11-02 DIAGNOSIS — M47812 Spondylosis without myelopathy or radiculopathy, cervical region: Secondary | ICD-10-CM | POA: Insufficient documentation

## 2020-11-04 ENCOUNTER — Other Ambulatory Visit: Payer: Self-pay

## 2020-11-04 ENCOUNTER — Encounter: Payer: Self-pay | Admitting: Physical Therapy

## 2020-11-04 ENCOUNTER — Ambulatory Visit: Payer: Medicare Other | Attending: Neurology | Admitting: Physical Therapy

## 2020-11-04 DIAGNOSIS — M542 Cervicalgia: Secondary | ICD-10-CM | POA: Insufficient documentation

## 2020-11-04 DIAGNOSIS — G8929 Other chronic pain: Secondary | ICD-10-CM | POA: Diagnosis present

## 2020-11-04 DIAGNOSIS — M6281 Muscle weakness (generalized): Secondary | ICD-10-CM | POA: Diagnosis present

## 2020-11-04 DIAGNOSIS — M545 Low back pain, unspecified: Secondary | ICD-10-CM | POA: Diagnosis present

## 2020-11-04 DIAGNOSIS — M47812 Spondylosis without myelopathy or radiculopathy, cervical region: Secondary | ICD-10-CM | POA: Diagnosis not present

## 2020-11-04 NOTE — Therapy (Signed)
[place], [place], [address] Phone: [phone]   Fax:  [phone]  Physical Therapy Treatment  Patient Details  Name: Seth Adams MRN: [id number] Date of Birth: 1956/06/08 Referring Provider (PT): Kathrynn Ducking, MD    Encounter Date: 11/04/2020   PT End of Session - 11/04/20 1155    Visit Number 12    Number of Visits 17    Date for PT Re-Evaluation 11/19/20    Authorization Type MCR: Kx mod at 15h visit    Progress Note Due on Visit 20    PT Start Time 1146    PT Stop Time 1230    PT Time Calculation (min) 44 min    Activity Tolerance Patient tolerated treatment well    Behavior During Therapy [facility] for tasks assessed/performed           Past Medical History:  Diagnosis Date   Cervical spondylosis 02/07/2019    Past Surgical History:  Procedure Laterality Date   BACK SURGERY     Lower back surgery x2   CATARACT EXTRACTION W/ INTRAOCULAR LENS  IMPLANT, BILATERAL     eyelid lift  1   2019 [place]      There were no vitals filed for this visit.   Subjective Assessment - 11/04/20 1148    Subjective "had a good night sleep last night for once muscle relaxers helped so much. Going to have an injection in my neck; something's gotta give."    Currently in Pain? Yes    Pain Score 6     Pain Location Shoulder    Pain Orientation Left    Pain Descriptors / Indicators Sharp    Aggravating Factors  Turning to the right    Pain Relieving Factors Muscle relaxer    Multiple Pain Sites Yes    Pain Score 5    Pain Location Neck    Pain Orientation Right    Pain Descriptors / Indicators Aching    Pain Onset More than a month ago    Pain Frequency Intermittent    Aggravating Factors  Turning to the right    Pain Relieving Factors sitting, resting                             [facility] Adult PT Treatment/Exercise - 11/04/20 0001      Neck  Exercises: Machines for Strengthening   UBE (Upper Arm Bike) L4 x 4 min       Neck Exercises: Seated   Neck Retraction 5 reps;Limitations    Neck Retraction Limitations Cuing for staying in cervical retraction, not flexion      Manual Therapy   Soft tissue mobilization IASTM for the levator scapulae, UT, cervical multifidi bilaterally       Neck Exercises: Stretches   Upper Trapezius Stretch 2 reps;30 seconds;Left;Right    Levator Stretch 2 reps;30 seconds;Right;Left            Trigger Point Dry Needling - 11/04/20 0001    Consent Given? Yes    Education Handout Provided Previously provided    Printmaker Performed with Dry Needling Yes    E-stim with Dry Needling Details frequency 20, x 4 min starting level to tolerance, and increasing as tolerated     Other Dry Needling 4 min at UT; 4 min at cervical multifidi for total of 8 min  Upper Trapezius Response Twitch reponse elicited;Palpable increased muscle length   bilateral   Cervical multifidi Response Twitch reponse elicited;Palpable increased muscle length   Bilateral               PT Education - 11/04/20 1202    Education Details Education on stress and physical response    Person(s) Educated Patient    Methods Explanation    Comprehension Verbalized understanding            PT Short Term Goals - 08/04/20 1245      PT SHORT TERM GOAL #1   Title Independent with initial HEP    Period Weeks    Status Achieved      PT SHORT TERM GOAL #2   Title to assess strength for bil UE/ LE and make goals accordingly    Period Weeks    Status Achieved             PT Long Term Goals - 10/22/20 1736      PT LONG TERM GOAL #1   Title increase cervical R Sidebending/ flexion  to [facility] with </= 2/10 pain for functional ROM required for ADLs    Period Weeks    Status Partially Met      PT LONG TERM GOAL #2   Title reduce cervical muscle spasm to redcue pain to </= 1/10 for QOL    Period Weeks    Status  Partially Met      PT LONG TERM GOAL #3   Title increase FOTO score to </= 38% limited to demo improvement in function    Period Weeks    Status Achieved      PT LONG TERM GOAL #4   Title improve trunk mobility in all planes to [facility] with </= 2/10 pain for function    Period Weeks    Status Achieved      PT LONG TERM GOAL #5   Title pt to be I with all HEP given to maintain and progress current level of functin IND    Period Weeks    Status On-going                 Plan - 11/04/20 1159    Clinical Impression Statement Pt reports increased pain levels in bilateral neck, with the worst pain report in left cervical spine. Pt was dry needled with E-STIM (all TPDN was performed by licensed and certified physical therapist) in bilateral cervical multifidi and UT. IASTM was performed to the same musculature to increase blood flow to the area. Pt then stretched the area and reported pain relief, but with onset of suboccipital headache that was relieved with chin tucks and tennis ball massage.    Stability/Clinical Decision Making Stable/Uncomplicated    Rehab Potential Good    PT Frequency 1x / week    PT Duration 2 weeks    PT Treatment/Interventions ADLs/Self Care Home Management;Cryotherapy;Electrical Stimulation;Iontophoresis 26m/ml Dexamethasone;Moist Heat;Traction;Ultrasound;Stair training;Therapeutic activities;Therapeutic exercise;Neuromuscular re-education;Manual techniques;Patient/family education;Passive range of motion;Dry needling;Taping    PT Next Visit Plan Scapular depression, lower trap, serratus strengthening, DN PRN for levator, pec minor/ upper trap, manual therapy    PT Home Exercise Plan [email or web address] - upper trap stretch, levator scapular stretch, book opening, LTR, cervical retraction    Consulted and Agree with Plan of Care Patient           Patient will benefit from skilled therapeutic intervention in order to improve the following deficits and impairments:   Improper  body mechanics, Decreased strength, Postural dysfunction, Pain, Decreased balance, Decreased activity tolerance, Decreased endurance, Decreased range of motion  Visit Diagnosis: Cervical spondylosis  Chronic right-sided low back pain, unspecified whether sciatica present  Cervicalgia  Muscle weakness (generalized)     Problem List Patient Active Problem List   Diagnosis Date Noted   Cervical spondylosis 02/07/2019    Dawayne Cirri, SPT 11/04/2020, 12:41 PM  [facility] [address] [place], [place], [address] Phone: [phone]   Fax:  [phone]  Name: Seth Adams MRN: [id number] Date of Birth: 1956-08-12

## 2020-11-04 NOTE — Patient Instructions (Signed)
Access Code: [email or web address] URL: [email or web address] Date: 11/04/2020 Prepared by: [facility] - Outpatient Rehab [facility].  Exercises Seated Upper Trapezius Stretch - 1 x daily - 7 x weekly - 2 sets - 2 reps - 30 seconds hold Gentle Levator Scapulae Stretch - 1 x daily - 7 x weekly - 2 sets - 2 reps - 30 seconds hold Sidelying Thoracic Rotation with Open Book - 1 x daily - 7 x weekly - 2 sets - 10 reps Lower Trunk Rotation - 1 x daily - 7 x weekly - 2 sets - 10 reps Seated Piriformis Stretch - 1 x daily - 7 x weekly - 2 sets - 2 reps - 30 hold Seated Cervical Retraction - 1 x daily - 7 x weekly - 2 sets - 10 reps - 5 seconds hold

## 2020-11-10 ENCOUNTER — Ambulatory Visit: Payer: Medicare Other | Admitting: Physical Therapy

## 2020-11-10 ENCOUNTER — Other Ambulatory Visit: Payer: Self-pay

## 2020-11-10 ENCOUNTER — Encounter: Payer: Self-pay | Admitting: Physical Therapy

## 2020-11-10 DIAGNOSIS — M47812 Spondylosis without myelopathy or radiculopathy, cervical region: Secondary | ICD-10-CM

## 2020-11-10 DIAGNOSIS — M6281 Muscle weakness (generalized): Secondary | ICD-10-CM

## 2020-11-10 DIAGNOSIS — M542 Cervicalgia: Secondary | ICD-10-CM

## 2020-11-10 DIAGNOSIS — M545 Low back pain, unspecified: Secondary | ICD-10-CM

## 2020-11-10 DIAGNOSIS — G8929 Other chronic pain: Secondary | ICD-10-CM

## 2020-11-10 NOTE — Therapy (Signed)
[place], [place], [address] Phone: [phone]   Fax:  [phone]  Physical Therapy Treatment /  Discharge  Patient Details  Name: Seth Adams MRN: [id number] Date of Birth: 08-Jan-1956 Referring Provider (PT): Kathrynn Ducking, MD    Encounter Date: 11/10/2020   PT End of Session - 11/10/20 1222    Visit Number 13    Number of Visits 17    Date for PT Re-Evaluation 11/19/20    Authorization Type MCR: Kx mod at 15h visit    PT Start Time 3888    PT Stop Time 1223    PT Time Calculation (min) 38 min    Activity Tolerance Patient tolerated treatment well    Behavior During Therapy [facility] for tasks assessed/performed           Past Medical History:  Diagnosis Date  . Cervical spondylosis 02/07/2019    Past Surgical History:  Procedure Laterality Date  . BACK SURGERY     Lower back surgery x2  . CATARACT EXTRACTION W/ INTRAOCULAR LENS  IMPLANT, BILATERAL    . eyelid lift  1   2019 [place]  . FOOT SURGERY      There were no vitals filed for this visit.   Subjective Assessment - 11/10/20 1150    Subjective " I feel like I am doing pretty today, still get some soreness."    Currently in Pain? Yes    Pain Score 5     Pain Location Shoulder    Pain Orientation Left    Pain Descriptors / Indicators Tightness    Pain Type Chronic pain    Pain Onset More than a month ago    Pain Frequency Intermittent              [facility] PT Assessment - 11/10/20 0001      Assessment   Medical Diagnosis Cervical spondylosis     Referring Provider (PT) Kathrynn Ducking, MD       AROM   Cervical Flexion 50    Cervical Extension 50    Cervical - Right Side Bend 26    Cervical - Left Side Bend 30    Cervical - Right Rotation 63    Cervical - Left Rotation 64                         [facility] Adult PT Treatment/Exercise - 11/10/20 0001      Self-Care   Other Self-Care  Comments  how to perform self trigger point release and tools to assist with self treatment.       Manual Therapy   Manual therapy comments MTPR along R upper trap/ levator scapulae   combined with education on how to perform at home   Soft tissue mobilization IASTM along the bil upper trap, levator scapuale and cervical paraspinals                  PT Education - 11/10/20 1221    Education Details Reassessment findings, Reviewed HEP, how to perform trigger point release and how it could be performed at home with tools. How to appropriately progress strengthening with increased reps/ sets and resistance.    Person(s) Educated Patient    Methods Explanation;Verbal cues    Comprehension Verbalized understanding            PT Short Term Goals - 08/04/20 1245  PT SHORT TERM GOAL #1   Title Independent with initial HEP    Period Weeks    Status Achieved      PT SHORT TERM GOAL #2   Title to assess strength for bil UE/ LE and make goals accordingly    Period Weeks    Status Achieved             PT Long Term Goals - 11/10/20 1153      PT LONG TERM GOAL #1   Title increase cervical R Sidebending/ flexion  to [facility] with </= 2/10 pain for functional ROM required for ADLs    Baseline continued elevated pain level today,    Period Weeks    Status Achieved      PT LONG TERM GOAL #2   Title reduce cervical muscle spasm to redcue pain to </= 1/10 for QOL    Period Weeks    Status Partially Met      PT LONG TERM GOAL #3   Title increase FOTO score to </= 38% limited to demo improvement in function    Period Weeks    Status Achieved      PT LONG TERM GOAL #4   Title improve trunk mobility in all planes to [facility] with </= 2/10 pain for function      PT LONG TERM GOAL #5   Title pt to be I with all HEP given to maintain and progress current level of functin IND                 Plan - 11/10/20 1223    Clinical Impression Statement Mr Dolin has made great  progress with physical therapy increasing his neck ROM. He continues to report neck pain which fluctuates based on postion which improves with stretching and soft tissue work but returns. He plans to get cervical facet injections in December which may improve his pain level. he met or partially met all goals today and is able to maintain and progress current LOF IND and will be discharged from PT today.    PT Treatment/Interventions ADLs/Self Care Home Management;Cryotherapy;Electrical Stimulation;Iontophoresis 50m/ml Dexamethasone;Moist Heat;Traction;Ultrasound;Stair training;Therapeutic activities;Therapeutic exercise;Neuromuscular re-education;Manual techniques;Patient/family education;Passive range of motion;Dry needling;Taping    PT Next Visit Plan Scapular depression, lower trap, serratus strengthening, DN PRN for levator, pec minor/ upper trap, manual therapy    PT Home Exercise Plan [email or web address] - upper trap stretch, levator scapular stretch, book opening, LTR, cervical retraction    Consulted and Agree with Plan of Care Patient           Patient will benefit from skilled therapeutic intervention in order to improve the following deficits and impairments:  Improper body mechanics, Decreased strength, Postural dysfunction, Pain, Decreased balance, Decreased activity tolerance, Decreased endurance, Decreased range of motion  Visit Diagnosis: Cervical spondylosis  Chronic right-sided low back pain, unspecified whether sciatica present  Cervicalgia  Muscle weakness (generalized)  Chronic bilateral low back pain without sciatica     Problem List Patient Active Problem List   Diagnosis Date Noted  . Cervical spondylosis 02/07/2019    LStarr Lake11/16/2021, 12:32 PM  [facility] [address]Phone: [phone]  Fax:  [phone] Name: RDAMONT BALLESMRN: [id number] of Birth:  [birth year]      PHYSICAL THERAPY DISCHARGE SUMMARY  Visits from Start of Care: 13  Current functional level related to goals / functional outcomes: See goals, FOTO 66% function  Remaining deficits: See assessment in note   Education / Equipment: HEP, theraband, self trigger point release, posture/ lifting mechanics,   Plan: Patient agrees to discharge.  Patient goals were partially met. Patient is being discharged due to meeting the stated rehab goals.  ?????         Ilisha Blust PT, DPT, LAT, ATC  11/10/20  12:33 PM

## 2023-12-05 ENCOUNTER — Telehealth: Payer: Self-pay | Admitting: Neurology

## 2023-12-05 NOTE — Telephone Encounter (Signed)
Pt requesting appt but hasn't been seen in over 3 years. Advised a new referral is needed in order to schedule- Morrie Sheldon

## 2024-03-20 ENCOUNTER — Ambulatory Visit (INDEPENDENT_AMBULATORY_CARE_PROVIDER_SITE_OTHER): Payer: Medicare Other | Admitting: Diagnostic Neuroimaging

## 2024-03-20 ENCOUNTER — Encounter: Payer: Self-pay | Admitting: Diagnostic Neuroimaging

## 2024-03-20 VITALS — BP 184/86 | HR 59 | Ht 69.0 in | Wt 151.0 lb

## 2024-03-20 DIAGNOSIS — R29898 Other symptoms and signs involving the musculoskeletal system: Secondary | ICD-10-CM

## 2024-03-20 DIAGNOSIS — G8929 Other chronic pain: Secondary | ICD-10-CM | POA: Diagnosis not present

## 2024-03-20 DIAGNOSIS — M25512 Pain in left shoulder: Secondary | ICD-10-CM | POA: Diagnosis not present

## 2024-03-20 NOTE — Patient Instructions (Addendum)
  LEFT SHOULDER PAIN / DISCOMFORT (likely rotator cuff tendonitis vs injury) - check uric acid level; history of gout - refer to sports medicine consult for musculoskeletal evaluation; then trial of OT   HYPERTENSION   03/20/24 1010 03/20/24 1013  BP: (!) 187/81 (!) 184/86  Pulse: (!) 59 (!) 59  - follow up with PCP

## 2024-03-20 NOTE — Progress Notes (Signed)
 [facility]  PATIENT: Seth Adams DOB: [birth year]  REFERRING CLINICIAN: Kaleen Mask, * HISTORY FROM: patient  REASON FOR VISIT: new consult   HISTORICAL  CHIEF COMPLAINT:  Chief Complaint  Patient presents with   Back Pain    Rm 6 alone Pt is well, reports his back pain has progressed since his visit with [place] in 2018. He reports when he picks up things, he gets a shooting pain from back of neck to front of chest.     HISTORY OF PRESENT ILLNESS:   68 year old male here for evaluation of left shoulder pain.  Symptoms started in 2018 with left shoulder and neck pain, diagnosed with cervical radiculopathy, treated with physical therapy, dry needling, nerve block.  Symptoms improved over time slightly.  In the last few months symptoms have been flared up.  Now having significant pain in his left posterior scapular region, trapezius region, left sternocleidal regions.  No numbness or tingling.   REVIEW OF SYSTEMS: Full 14 system review of systems performed and negative with exception of: as per HPI.  ALLERGIES: Allergies  Allergen Reactions   Codeine Itching and Nausea Only    HOME MEDICATIONS: Outpatient Medications Prior to Visit  Medication Sig Dispense Refill   cetirizine (ZYRTEC) 10 MG tablet Take 10 mg by mouth daily.     allopurinol (ZYLOPRIM) 300 MG tablet Take 300 mg by mouth as needed. (Patient not taking: Reported on 03/20/2024)     CALCIUM PO Take 1 tablet by mouth daily. (Patient not taking: Reported on 03/20/2024)     Cholecalciferol (VITAMIN D PO) Take 1 tablet by mouth daily. (Patient not taking: Reported on 03/20/2024)     CINNAMON PO Take 1 tablet by mouth 2 (two) times daily. (Patient not taking: Reported on 03/20/2024)     colchicine 0.6 MG tablet Take 0.6 mg by mouth as needed.  (Patient not taking: Reported on 03/20/2024)     cyclobenzaprine (FLEXERIL) 10 MG tablet Take 1 tablet (10 mg total) by mouth 3 (three) times daily as  needed for muscle spasms. (Patient not taking: Reported on 03/20/2024) 90 tablet 3   fluticasone (FLONASE) 50 MCG/ACT nasal spray Place 2 sprays into both nostrils daily. (Patient not taking: Reported on 03/20/2024)     GARLIC PO Take 1 tablet by mouth 2 (two) times daily. (Patient not taking: Reported on 03/20/2024)     ibuprofen (ADVIL,MOTRIN) 100 MG tablet Take 100 mg by mouth every 6 (six) hours as needed for fever. (Patient not taking: Reported on 03/20/2024)     Multiple Vitamin (MULTIVITAMIN WITH MINERALS) TABS tablet Take 1 tablet by mouth daily. (Patient not taking: Reported on 03/20/2024)     naproxen (NAPROSYN) 500 MG tablet Take 1 tablet (500 mg total) by mouth 2 (two) times daily. (Patient not taking: Reported on 03/20/2024) 30 tablet 0   OVER THE COUNTER MEDICATION Take 5 drops by mouth daily. Mixes Frankincense and Thieves in liquid to drink daily (Patient not taking: Reported on 03/20/2024)     predniSONE (DELTASONE) 5 MG tablet Begin taking 6 tablets daily, taper by one tablet daily until off the medication. (Patient not taking: Reported on 03/20/2024) 21 tablet 0   traMADol (ULTRAM) 50 MG tablet Take 0.5 tablets (25 mg total) by mouth every 6 (six) hours as needed for moderate pain. (Patient not taking: Reported on 03/20/2024) 10 tablet 0   No facility-administered medications prior to visit.    PAST MEDICAL HISTORY: Past Medical History:  Diagnosis  Date   Cervical spondylosis 02/07/2019    PAST SURGICAL HISTORY: Past Surgical History:  Procedure Laterality Date   BACK SURGERY     Lower back surgery x2   CATARACT EXTRACTION W/ INTRAOCULAR LENS  IMPLANT, BILATERAL     eyelid lift  1   2019 [facility]   FOOT SURGERY      FAMILY HISTORY: History reviewed. No pertinent family history.  SOCIAL HISTORY: Social History   Socioeconomic History   Marital status: Married    Spouse name: Olegario Messier    Number of children: 1   Years of education: 16   Highest education level:  Not on file  Occupational History   Occupation: Self employed  Tobacco Use   Smoking status: Never   Smokeless tobacco: Never  Vaping Use   Vaping status: Never Used  Substance and Sexual Activity   Alcohol use: Yes    Comment: a beer every once in a while   Drug use: Never   Sexual activity: Not on file  Other Topics Concern   Not on file  Social History Narrative   Lives with wife   Caffeine use: 1 5-hour energy drink per day   Right handed   Social Drivers of Health   Financial Resource Strain: Not on file  Food Insecurity: Not on file  Transportation Needs: Not on file  Physical Activity: Not on file  Stress: Not on file  Social Connections: Not on file  Intimate Partner Violence: Not on file     PHYSICAL EXAM  GENERAL EXAM/CONSTITUTIONAL: Vitals:  Vitals:   03/20/24 1010 03/20/24 1013  BP: (!) 187/81 (!) 184/86  Pulse: (!) 59 (!) 59  Weight: 151 lb (68.5 kg)   Height: 5\' 9"  (1.753 m)    Body mass index is 22.3 kg/m. Wt Readings from Last 3 Encounters:  03/20/24 151 lb (68.5 kg)  02/07/19 185 lb (83.9 kg)  08/01/18 186 lb (84.4 kg)   Patient is in no distress; well developed, nourished and groomed; neck is supple  CARDIOVASCULAR: Examination of carotid arteries is normal; no carotid bruits Regular rate and rhythm, no murmurs Examination of peripheral vascular system by observation and palpation is normal  EYES: Ophthalmoscopic exam of optic discs and posterior segments is normal; no papilledema or hemorrhages No results found.  MUSCULOSKELETAL: Gait, strength, tone, movements noted in Neurologic exam below  NEUROLOGIC: MENTAL STATUS:      No data to display         awake, alert, oriented to person, place and time recent and remote memory intact normal attention and concentration language fluent, comprehension intact, naming intact fund of knowledge appropriate  CRANIAL NERVE:  2nd - no papilledema on fundoscopic exam 2nd, 3rd, 4th,  6th - pupils equal and reactive to light, visual fields full to confrontation, extraocular muscles intact, no nystagmus 5th - facial sensation symmetric 7th - facial strength symmetric 8th - hearing intact 9th - palate elevates symmetrically, uvula midline 11th - shoulder shrug --> DECR ON LEFT 12th - tongue protrusion midline  MOTOR:  normal bulk and tone, full strength in the BUE, BLE; EXCEPT LEFT ARM EXTERNAL ROTATION 3/5, DELTOID 3/5; WITH PAIN  SENSORY:  normal and symmetric to light touch, temperature, vibration  COORDINATION:  finger-nose-finger, fine finger movements normal  REFLEXES:  deep tendon reflexes TRACE and symmetric  GAIT/STATION:  narrow based gait   DIAGNOSTIC DATA (LABS, IMAGING, TESTING) - I reviewed patient records, labs, notes, testing and imaging myself where available.  Lab Results  Component Value Date   WBC 11.5 (H) 04/29/2017   HGB 13.9 04/29/2017   HCT 41.0 04/29/2017   MCV 88.5 04/29/2017   PLT 215 04/29/2017      Component Value Date/Time   NA 138 04/29/2017 1553   K 4.1 04/29/2017 1553   CL 104 04/29/2017 1553   CO2 25 04/29/2017 1535   GLUCOSE 220 (H) 04/29/2017 1553   BUN 16 04/29/2017 1553   CREATININE 0.60 (L) 04/29/2017 1553   CALCIUM 9.3 04/29/2017 1535   GFRNONAA >60 04/29/2017 1535   GFRAA >60 04/29/2017 1535   No results found for: "CHOL", "HDL", "LDLCALC", "LDLDIRECT", "TRIG", "CHOLHDL" No results found for: "HGBA1C" No results found for: "VITAMINB12" No results found for: "TSH"     ASSESSMENT AND PLAN  68 y.o. year old male here with:   Dx:  1. Chronic left shoulder pain   2. Weakness of left shoulder     PLAN:  LEFT SHOULDER PAIN / DISCOMFORT (likely rotator cuff tendonitis vs other strain / injury) - check uric acid level; history of gout - refer to sports medicine consult for musculoskeletal evaluation; then trial of OT   HYPERTENSION   03/20/24 1010 03/20/24 1013  BP: (!) 187/81 (!) 184/86   Pulse: (!) 59 (!) 59   - follow up with PCP  Orders Placed This Encounter  Procedures   Uric Acid   AMB referral to sports medicine   Return for pending if symptoms worsen or fail to improve, pending test results.    Suanne Marker, MD 03/20/2024, 11:17 AM Certified in Neurology, Neurophysiology and Neuroimaging  [facility] [address], [address] [place], [place] [address] [phone]

## 2024-03-21 ENCOUNTER — Telehealth: Payer: Self-pay

## 2024-03-21 LAB — URIC ACID: Uric Acid: 5.6 mg/dL (ref 3.8–8.4)

## 2024-03-21 NOTE — Telephone Encounter (Signed)
 Spoke w/Pt regarding lab results, per MD uric acid level normal with no major findings and to continue the current plan. Pt stated understanding. Pt also stated he wants to heed the advise of MD regarding blood pressure. Pt inquired if he was to reach out to his PCP or if Dr. Marjory Lies would want to manage - advised Pt to reach out to his PCP. Pt stated understanding and that he really likes Dr. Marjory Lies and is glad he was able to establish as a Pt of his.

## 2024-03-21 NOTE — Progress Notes (Signed)
 Uric acid results are good, no major findings. Continue current plan. -VRP

## 2024-03-26 NOTE — Progress Notes (Unsigned)
 Rubin Payor, PhD, LAT, ATC acting as a scribe for Clementeen Graham, MD.  Seth Adams is a 68 y.o. male who presents to [facility] at [facility] today for L shoulder pain that initially began in 2018, flaring up over the last few months. He is RHD. Pt locates pain to periscapular region, posterior shoulder, w/ radiating pain across the R trapz and clavicular region. Sometimes pain will also be up into the L-side of his neck. Knot noted along the L [place] joint.  Radiates: yes UE Numbness/tingling: no UE Weakness: no Aggravates: horz aBd Treatments tried: prior PT, dry needling  Pertinent review of systems: No fevers or chills  Relevant historical information: Arthritis C-spine.   Exam:  BP (!) 142/86   Pulse 68   Ht 5\' 9"  (1.753 m)   Wt 148 lb (67.1 kg)   SpO2 98%   BMI 21.86 kg/m  General: Well Developed, well nourished, and in no acute distress.   MSK: C-spine: Normal appearing.  Nontender palpation midline.  Tender palpation left cervical paraspinal musculature. Normal cervical motion. Upper extremity strength is intact except noted below.  Left shoulder normal appearing. Tender palpation left trapezius. Shoulder motion abduction shows significant scapular protraction and some winging. Isolated shoulder rotator cuff strength testing is mildly reduced but generally normal rated 4+/5.  No significant isolated rotator cuff weakness. Mildly positive Hawkins and Neer's test.  Left chest wall some swelling at [facility] joint.  Tender palpation.   Hands bilaterally bossing first CMC tender palpation in this region.  Lab and Radiology Results  Procedure: Real-time Ultrasound Guided Injection of right first [facility] Device: Philips Affiniti 50G/GE Logiq Images permanently stored and available for review in PACS Verbal informed consent obtained.  Discussed risks and benefits of procedure. Warned about infection, bleeding, hyperglycemia damage to structures among  others. Patient expresses understanding and agreement Time-out conducted.   Noted no overlying erythema, induration, or other signs of local infection.   Skin prepped in a sterile fashion.   Local anesthesia: Topical Ethyl chloride.   With sterile technique and under real time ultrasound guidance: 0.5 mL of 40 mg/mL triamcinolone and 0.5 ml of lidocaine injected into first CMC. Fluid seen entering the joint capsule.   Completed without difficulty   Pain immediately resolved suggesting accurate placement of the medication.   Advised to call if fevers/chills, erythema, induration, drainage, or persistent bleeding.   Images permanently stored and available for review in the ultrasound unit.  Impression: Technically successful ultrasound guided injection.    Procedure: Real-time Ultrasound Guided Injection of left first [facility] Device: Philips Affiniti 50G/GE Logiq Images permanently stored and available for review in PACS Verbal informed consent obtained.  Discussed risks and benefits of procedure. Warned about infection, bleeding, hyperglycemia damage to structures among others. Patient expresses understanding and agreement Time-out conducted.   Noted no overlying erythema, induration, or other signs of local infection.   Skin prepped in a sterile fashion.   Local anesthesia: Topical Ethyl chloride.   With sterile technique and under real time ultrasound guidance: 0.5 mL of 40 mg/mL triamcinolone and 0.5 mL of lidocaine injected into first CMC joint. Fluid seen entering the joint capsule.   Completed without difficulty   Pain immediately resolved suggesting accurate placement of the medication.   Advised to call if fevers/chills, erythema, induration, drainage, or persistent bleeding.   Images permanently stored and available for review in the ultrasound unit.  Impression: Technically successful ultrasound guided injection.  X-ray images C-spine, left shoulder, left clavicle, and  bilateral hands obtained today personally and independently interpreted.  C-spine: Diffuse multilevel degenerative changes severe at C3-C7.  No acute fractures are visible.  Left shoulder: Minimal glenohumeral DJD.  Moderate AC DJD.  No acute fractures are visible.  Left clavicle: Significant [place] DJD.   Right hand: DJD first CMC.  Mild to moderate degenerative changes present at IP joint thumb and MCP, PIP, and DIP joints across fingers.  No acute fractures are visible.  Old avulsion fracture visible at fifth MCP ulnar aspect.  Left hand: Severe first CMC DJD.  Diffuse degenerative changes across MCPs PIPs and DIPs.  No acute fractures are visible.  Await formal radiology review   Assessment and Plan: 68 y.o. male with multifactorial shoulder girdle pain and bilateral hand pain..  Patient has significant discomfort and pain along the left trapezius and rhomboids associated with scapular dyskinesis and protraction.  He is a good candidate for physical therapy.  He does have multiple areas of degeneration especially in his C-spine and [place] joint that could be contributors to this as well.  Plan for PT.  Bilateral hand pain.  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  Injections provided today.  We reviewed conservative management options including Voltaren gel heat and some basic hand exercises. Recheck in 6 weeks.  PDMP not reviewed this encounter. Orders Placed This Encounter  Procedures   [place] LIMITED JOINT SPACE STRUCTURES UP RIGHT(NO LINKED CHARGES)    Reason for Exam (SYMPTOM  OR DIAGNOSIS REQUIRED):   right shoulder pain    Preferred imaging location?:   [facility] Cervical Spine 2 or 3 views    Standing Status:   Future    Number of Occurrences:   1    Expiration Date:   03/27/2025    Reason for Exam (SYMPTOM  OR DIAGNOSIS REQUIRED):   neck pain    Preferred imaging location?:    [facility]   DG Clavicle Left    Standing Status:   Future    Number of Occurrences:   1    Expiration Date:   03/27/2025    Reason for Exam (SYMPTOM  OR DIAGNOSIS REQUIRED):   clavicle pain    Preferred imaging location?:   [facility]   DG Shoulder Left    Standing Status:   Future    Number of Occurrences:   1    Expiration Date:   04/26/2024    Reason for Exam (SYMPTOM  OR DIAGNOSIS REQUIRED):   left shoulder pain    Preferred imaging location?:   [facility]   DG Hand Complete Left    Standing Status:   Future    Number of Occurrences:   1    Expiration Date:   04/26/2024    Reason for Exam (SYMPTOM  OR DIAGNOSIS REQUIRED):   bilateral hand pain    Preferred imaging location?:   [facility]   DG Hand Complete Right    Standing Status:   Future    Number of Occurrences:   1    Expiration Date:   04/26/2024    Reason for Exam (SYMPTOM  OR DIAGNOSIS REQUIRED):   bilateral hand pain    Preferred imaging location?:   [facility]   Ambulatory referral to Physical Therapy    Referral Priority:   Routine  Referral Type:   Physical Medicine    Referral Reason:   Specialty Services Required    Requested Specialty:   Physical Therapy    Number of Visits Requested:   1   No orders of the defined types were placed in this encounter.    Discussed warning signs or symptoms. Please see discharge instructions. Patient expresses understanding.   The above documentation has been reviewed and is accurate and complete Clementeen Graham, M.D.

## 2024-03-27 ENCOUNTER — Other Ambulatory Visit: Payer: Self-pay

## 2024-03-27 ENCOUNTER — Ambulatory Visit: Admitting: Family Medicine

## 2024-03-27 ENCOUNTER — Ambulatory Visit (INDEPENDENT_AMBULATORY_CARE_PROVIDER_SITE_OTHER)

## 2024-03-27 VITALS — BP 142/86 | HR 68 | Ht 69.0 in | Wt 148.0 lb

## 2024-03-27 DIAGNOSIS — M25512 Pain in left shoulder: Secondary | ICD-10-CM | POA: Diagnosis not present

## 2024-03-27 DIAGNOSIS — M79642 Pain in left hand: Secondary | ICD-10-CM | POA: Diagnosis not present

## 2024-03-27 DIAGNOSIS — M79641 Pain in right hand: Secondary | ICD-10-CM | POA: Diagnosis not present

## 2024-03-27 DIAGNOSIS — G8929 Other chronic pain: Secondary | ICD-10-CM

## 2024-03-27 NOTE — Patient Instructions (Addendum)
 Thank you for coming in today.   Please get an Xray today before you leave   Please use Voltaren gel (Generic Diclofenac Gel) up to 4x daily for pain as needed.  This is available over-the-counter as both the name brand Voltaren gel and the generic diclofenac gel.   You received an injection today. Seek immediate medical attention if the joint becomes red, extremely painful, or is oozing fluid.   I've referred you to our physical therapy location. Please schedule you initial evaluation at the check-out desk before you leave  Check back in 6 weeks

## 2024-03-28 NOTE — Progress Notes (Signed)
 Left collarbone looks okay to the radiologist.  When I saw it myself I thought the joint at your chest looked like it has arthritis.

## 2024-03-28 NOTE — Progress Notes (Signed)
 Left hand x-ray shows medium arthritis at the thumb and arthritis multiple locations in the hand.

## 2024-03-28 NOTE — Progress Notes (Signed)
 Right hand x-ray shows arthritis multiple locations in the hand.

## 2024-03-28 NOTE — Progress Notes (Signed)
 Left shoulder x-ray shows medium arthritis of the shoulder joint.

## 2024-03-28 NOTE — Progress Notes (Signed)
Cervical spine x-ray shows medium arthritis
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
## 2024-04-02 ENCOUNTER — Encounter: Payer: Self-pay | Admitting: Physical Therapy

## 2024-04-02 ENCOUNTER — Ambulatory Visit (INDEPENDENT_AMBULATORY_CARE_PROVIDER_SITE_OTHER): Admitting: Physical Therapy

## 2024-04-02 ENCOUNTER — Other Ambulatory Visit: Payer: Self-pay

## 2024-04-02 DIAGNOSIS — G8929 Other chronic pain: Secondary | ICD-10-CM

## 2024-04-02 DIAGNOSIS — M6281 Muscle weakness (generalized): Secondary | ICD-10-CM

## 2024-04-02 DIAGNOSIS — M25512 Pain in left shoulder: Secondary | ICD-10-CM

## 2024-04-02 NOTE — Patient Instructions (Signed)
 Access Code: [email or web address] URL: [email or web address] Date: 04/02/2024 Prepared by: Rosana Hoes  Exercises - Shoulder External Rotation and Scapular Retraction with Resistance  - 1 x daily - 3 sets - 10 reps - 3 seconds hold - Sidelying Thoracic Lumbar Rotation  - 1 x daily - 10 reps - 3 seconds hold

## 2024-04-08 ENCOUNTER — Ambulatory Visit (INDEPENDENT_AMBULATORY_CARE_PROVIDER_SITE_OTHER): Admitting: Physical Therapy

## 2024-04-08 ENCOUNTER — Encounter: Payer: Self-pay | Admitting: Physical Therapy

## 2024-04-08 ENCOUNTER — Other Ambulatory Visit: Payer: Self-pay

## 2024-04-08 DIAGNOSIS — M6281 Muscle weakness (generalized): Secondary | ICD-10-CM | POA: Diagnosis not present

## 2024-04-08 DIAGNOSIS — G8929 Other chronic pain: Secondary | ICD-10-CM | POA: Diagnosis not present

## 2024-04-08 DIAGNOSIS — M25512 Pain in left shoulder: Secondary | ICD-10-CM
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
## 2024-04-08 NOTE — Patient Instructions (Signed)
 Access Code: [email or web address] URL: [email or web address] Date: 04/08/2024 Prepared by: Leah Primus  Exercises - Shoulder External Rotation and Scapular Retraction with Resistance  - 1 x daily - 3 sets - 10 reps - 3 seconds hold - Sidelying Thoracic Lumbar Rotation  - 1 x daily - 10 reps - 3 seconds hold - Supine Shoulder Horizontal Abduction with Resistance  - 1 x daily - 3 sets - 10 reps

## 2024-04-10 ENCOUNTER — Other Ambulatory Visit: Payer: Self-pay

## 2024-04-10 ENCOUNTER — Encounter: Payer: Self-pay | Admitting: Physical Therapy

## 2024-04-10 ENCOUNTER — Ambulatory Visit (INDEPENDENT_AMBULATORY_CARE_PROVIDER_SITE_OTHER): Admitting: Physical Therapy

## 2024-04-10 DIAGNOSIS — M6281 Muscle weakness (generalized): Secondary | ICD-10-CM | POA: Diagnosis not present

## 2024-04-10 DIAGNOSIS — G8929 Other chronic pain: Secondary | ICD-10-CM | POA: Diagnosis not present

## 2024-04-10 DIAGNOSIS — M25512 Pain in left shoulder: Secondary | ICD-10-CM | POA: Diagnosis not present

## 2024-04-10 NOTE — Patient Instructions (Signed)
 Access Code: [email or web address] URL: [email or web address] Date: 04/10/2024 Prepared by: Leah Primus  Exercises - Shoulder External Rotation and Scapular Retraction with Resistance  - 1 x daily - 3 sets - 10 reps - 3 seconds hold - Sidelying Thoracic Lumbar Rotation  - 1 x daily - 10 reps - 3 seconds hold - Supine Shoulder Horizontal Abduction with Resistance  - 1 x daily - 3 sets - 10 reps - Supine Bilateral Shoulder Protraction  - 1 x daily - 3 sets - 10 reps - Standing shoulder flexion wall slides  - 2-3 x daily - 10 reps - 5 seconds hold
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
## 2024-04-15 ENCOUNTER — Encounter: Admitting: Physical Therapy

## 2024-04-17 ENCOUNTER — Encounter: Payer: Self-pay | Admitting: Physical Therapy

## 2024-04-17 ENCOUNTER — Ambulatory Visit (INDEPENDENT_AMBULATORY_CARE_PROVIDER_SITE_OTHER): Admitting: Physical Therapy

## 2024-04-17 ENCOUNTER — Other Ambulatory Visit: Payer: Self-pay

## 2024-04-17 DIAGNOSIS — M25512 Pain in left shoulder: Secondary | ICD-10-CM

## 2024-04-17 DIAGNOSIS — G8929 Other chronic pain: Secondary | ICD-10-CM

## 2024-04-17 DIAGNOSIS — M6281 Muscle weakness (generalized): Secondary | ICD-10-CM | POA: Diagnosis not present
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
## 2024-04-22 ENCOUNTER — Ambulatory Visit (INDEPENDENT_AMBULATORY_CARE_PROVIDER_SITE_OTHER): Admitting: Physical Therapy

## 2024-04-22 ENCOUNTER — Encounter: Payer: Self-pay | Admitting: Physical Therapy

## 2024-04-22 ENCOUNTER — Other Ambulatory Visit: Payer: Self-pay

## 2024-04-22 DIAGNOSIS — M25512 Pain in left shoulder: Secondary | ICD-10-CM

## 2024-04-22 DIAGNOSIS — G8929 Other chronic pain: Secondary | ICD-10-CM

## 2024-04-22 DIAGNOSIS — M6281 Muscle weakness (generalized): Secondary | ICD-10-CM
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
## 2024-04-24 ENCOUNTER — Encounter: Payer: Self-pay | Admitting: Physical Therapy

## 2024-04-24 ENCOUNTER — Ambulatory Visit: Admitting: Physical Therapy

## 2024-04-24 ENCOUNTER — Other Ambulatory Visit: Payer: Self-pay

## 2024-04-24 DIAGNOSIS — M6281 Muscle weakness (generalized): Secondary | ICD-10-CM | POA: Diagnosis not present

## 2024-04-24 DIAGNOSIS — M25512 Pain in left shoulder: Secondary | ICD-10-CM

## 2024-04-24 DIAGNOSIS — G8929 Other chronic pain: Secondary | ICD-10-CM | POA: Diagnosis not present
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
## 2024-04-29 ENCOUNTER — Ambulatory Visit (INDEPENDENT_AMBULATORY_CARE_PROVIDER_SITE_OTHER): Admitting: Physical Therapy

## 2024-04-29 ENCOUNTER — Encounter: Payer: Self-pay | Admitting: Physical Therapy

## 2024-04-29 ENCOUNTER — Other Ambulatory Visit: Payer: Self-pay

## 2024-04-29 DIAGNOSIS — M6281 Muscle weakness (generalized): Secondary | ICD-10-CM

## 2024-04-29 DIAGNOSIS — M25512 Pain in left shoulder: Secondary | ICD-10-CM

## 2024-04-29 DIAGNOSIS — G8929 Other chronic pain: Secondary | ICD-10-CM
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
## 2024-05-01 ENCOUNTER — Other Ambulatory Visit: Payer: Self-pay

## 2024-05-01 ENCOUNTER — Encounter: Payer: Self-pay | Admitting: Physical Therapy

## 2024-05-01 ENCOUNTER — Ambulatory Visit (INDEPENDENT_AMBULATORY_CARE_PROVIDER_SITE_OTHER): Admitting: Physical Therapy

## 2024-05-01 DIAGNOSIS — M25512 Pain in left shoulder: Secondary | ICD-10-CM | POA: Diagnosis not present

## 2024-05-01 DIAGNOSIS — M6281 Muscle weakness (generalized): Secondary | ICD-10-CM | POA: Diagnosis not present

## 2024-05-01 DIAGNOSIS — G8929 Other chronic pain: Secondary | ICD-10-CM | POA: Diagnosis not present
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
## 2024-05-08 ENCOUNTER — Other Ambulatory Visit: Payer: Self-pay

## 2024-05-08 ENCOUNTER — Encounter: Payer: Self-pay | Admitting: Physical Therapy

## 2024-05-08 ENCOUNTER — Ambulatory Visit: Payer: Self-pay

## 2024-05-08 ENCOUNTER — Ambulatory Visit (INDEPENDENT_AMBULATORY_CARE_PROVIDER_SITE_OTHER): Admitting: Physical Therapy

## 2024-05-08 ENCOUNTER — Encounter: Payer: Self-pay | Admitting: Family Medicine

## 2024-05-08 ENCOUNTER — Ambulatory Visit: Admitting: Family Medicine

## 2024-05-08 VITALS — BP 150/92 | HR 66 | Ht 69.0 in | Wt 146.0 lb

## 2024-05-08 DIAGNOSIS — G8929 Other chronic pain: Secondary | ICD-10-CM

## 2024-05-08 DIAGNOSIS — M7122 Synovial cyst of popliteal space [Baker], left knee: Secondary | ICD-10-CM

## 2024-05-08 DIAGNOSIS — M25512 Pain in left shoulder: Secondary | ICD-10-CM | POA: Diagnosis not present

## 2024-05-08 DIAGNOSIS — M25562 Pain in left knee: Secondary | ICD-10-CM | POA: Diagnosis not present

## 2024-05-08 DIAGNOSIS — M6281 Muscle weakness (generalized): Secondary | ICD-10-CM

## 2024-05-08 NOTE — Patient Instructions (Addendum)
 Thank you for coming in today.   Call or go to the ER if you develop a large red swollen joint with extreme pain or oozing puss.    I will try to figure out the cost of dry needling.   Continue PT.   You had a Baker Cyst in the knee

## 2024-05-08 NOTE — Progress Notes (Signed)
 I, Miquel Amen, CMA acting as a scribe for Seth Juniper, MD.  Seth Adams is a 68 y.o. male who presents to [facility] today for f/u L shoulder and bilat hand pain. Pt was last seen by Dr. Alease Hunter on 03/27/24 and was given a bilat 1st [facility]. Pt was advised to use heat, Voltaren gel, and was referred to PT, completing 8 visits.  Today, pt reports improvement with PT. Continues to have some pain with lifting. Compliant with HEP. Tylenol  prn. Denies new or worsening sx.   Additionally he notes swelling and discomfort in the left posterior knee.  He notes that this is difficult to bend his knee fully or straighten it fully.  Dx imaging: 03/27/24 R & L hand, L shoulder, L clavicle, and C-spine XR  Pertinent review of systems: No fevers or chills  Relevant historical information: Cervical spondylosis   Exam:  BP (!) 150/92   Pulse 66   Ht 5\' 9"  (1.753 m)   Wt 146 lb (66.2 kg)   SpO2 97%   BMI 21.56 kg/m  General: Well Developed, well nourished, and in no acute distress.   MSK: Hands bilaterally degenerative changes.  Left knee normal-appearing Lacks full range of motion lacking full flexion and full extension. Posterior swelling palpate a ball at the posterior medial knee. Intact strength.    Lab and Radiology Results  Procedure: Real-time Ultrasound Guided aspiration and injection of Baker's cyst posterior left knee. Device: Philips Affiniti 50G/GE Logiq Images permanently stored and available for review in PACS Verbal informed consent obtained.  Discussed risks and benefits of procedure. Warned about infection, bleeding, hyperglycemia damage to structures among others. Patient expresses understanding and agreement Time-out conducted.   Noted no overlying erythema, induration, or other signs of local infection.   Skin prepped in a sterile fashion.   Local anesthesia: Topical Ethyl chloride.   With sterile technique and under real time  ultrasound guidance:  2 mL of lidocaine injected subcutaneous tissue overlying Baker's cyst achieving good anesthesia. Skin sterilized with isopropyl alcohol. 18-gauge needle used to access the Baker's cyst. 30 mL of clear yellow fluid was aspirated.  The cyst was seen to be decompressed on ultrasound following the aspiration. Syringe was exchanged and 40 mg of Kenalog and 2 mL of Marcaine injected into knee joint. Fluid seen entering the joint capsule.   Completed without difficulty   Pain immediately resolved suggesting accurate placement of the medication.   Advised to call if fevers/chills, erythema, induration, drainage, or persistent bleeding.   Images permanently stored and available for review in the ultrasound unit.  Impression: Technically successful ultrasound guided injection.       Assessment and Plan: 69 y.o. male with left posterior knee Baker's cyst.  Plan for aspiration injection today.  Additionally he is improving with physical therapy for his upper back pain.  Plan to continue PT.  Check back as needed.   PDMP not reviewed this encounter. Orders Placed This Encounter  Procedures   US  LIMITED JOINT SPACE STRUCTURES LOW LEFT(NO LINKED CHARGES)    Reason for Exam (SYMPTOM  OR DIAGNOSIS REQUIRED):   eval left knee    Preferred imaging location?:   [facility]   No orders of the defined types were placed in this encounter.    Discussed warning signs or symptoms. Please see discharge instructions. Patient expresses understanding.   The above documentation has been reviewed and is accurate and complete Seth Adams, M.D.
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
## 2024-05-15 ENCOUNTER — Encounter: Admitting: Physical Therapy

## 2024-05-29 ENCOUNTER — Encounter: Payer: Self-pay | Admitting: Physical Therapy

## 2024-05-29 ENCOUNTER — Other Ambulatory Visit: Payer: Self-pay

## 2024-05-29 ENCOUNTER — Ambulatory Visit: Admitting: Physical Therapy

## 2024-05-29 DIAGNOSIS — G8929 Other chronic pain: Secondary | ICD-10-CM | POA: Diagnosis not present

## 2024-05-29 DIAGNOSIS — M6281 Muscle weakness (generalized): Secondary | ICD-10-CM | POA: Diagnosis not present

## 2024-05-29 DIAGNOSIS — M25512 Pain in left shoulder: Secondary | ICD-10-CM
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
## 2024-05-29 NOTE — Patient Instructions (Signed)
 Access Code: [email or web address] URL: [email or web address] Date: 05/29/2024 Prepared by: Leah Primus  Exercises - Supine Scapular Protraction in Flexion with Dumbbells  - 1 x daily - 2 sets - 20 reps - Supine Shoulder Horizontal Abduction with Resistance  - 1 x daily - 2 sets - 20 reps - Sidelying Shoulder ER with Towel and Dumbbell  - 1 x daily - 2 sets - 20 reps - Sidelying Shoulder Abduction Full Range of Motion with Dumbbell  - 1 x daily - 2 sets - 10 reps - Standing shoulder flexion wall slides  - 2-3 x daily - 10 reps - 5 seconds hold - Standing Row with Anchored Resistance  - 1 x daily - 2 sets - 20 reps - Scaption with Dumbbells  - 1 x daily - 2 sets - 10 reps
